# Patient Record
Sex: Female | Born: 1990 | Race: Black or African American | Hispanic: No | Marital: Single | Smoking: Never smoker
Health system: Southern US, Community
[De-identification: ages and names within clinical notes are randomized; demographics above are authoritative.]

## PROBLEM LIST (undated history)

## (undated) DIAGNOSIS — R87610 Atypical squamous cells of undetermined significance on cytologic smear of cervix (ASC-US): Secondary | ICD-10-CM

## (undated) DIAGNOSIS — R87810 Cervical high risk human papillomavirus (HPV) DNA test positive: Secondary | ICD-10-CM

## (undated) HISTORY — DX: Cervical high risk human papillomavirus (HPV) DNA test positive: R87.810

## (undated) HISTORY — PX: TONSILLECTOMY: SUR1361

## (undated) HISTORY — DX: Atypical squamous cells of undetermined significance on cytologic smear of cervix (ASC-US): R87.610

---
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
## 2013-04-03 ENCOUNTER — Emergency Department (HOSPITAL_COMMUNITY)
Admission: EM | Admit: 2013-04-03 | Discharge: 2013-04-03 | Disposition: A | Discharge status: Home / Self Care | Payer: No Typology Code available for payment source | Attending: Emergency Medicine | Admitting: Emergency Medicine

## 2013-04-03 ENCOUNTER — Emergency Department (HOSPITAL_COMMUNITY): Payer: No Typology Code available for payment source

## 2013-04-03 ENCOUNTER — Encounter (HOSPITAL_COMMUNITY): Payer: Self-pay | Admitting: Emergency Medicine

## 2013-04-03 DIAGNOSIS — F17200 Nicotine dependence, unspecified, uncomplicated: Secondary | ICD-10-CM | POA: Insufficient documentation

## 2013-04-03 DIAGNOSIS — Y92410 Unspecified street and highway as the place of occurrence of the external cause: Secondary | ICD-10-CM | POA: Insufficient documentation

## 2013-04-03 DIAGNOSIS — Z79899 Other long term (current) drug therapy: Secondary | ICD-10-CM | POA: Insufficient documentation

## 2013-04-03 DIAGNOSIS — F411 Generalized anxiety disorder: Secondary | ICD-10-CM | POA: Insufficient documentation

## 2013-04-03 DIAGNOSIS — S0990XA Unspecified injury of head, initial encounter: Secondary | ICD-10-CM | POA: Insufficient documentation

## 2013-04-03 DIAGNOSIS — S0993XA Unspecified injury of face, initial encounter: Secondary | ICD-10-CM | POA: Insufficient documentation

## 2013-04-03 DIAGNOSIS — IMO0002 Reserved for concepts with insufficient information to code with codable children: Secondary | ICD-10-CM | POA: Insufficient documentation

## 2013-04-03 DIAGNOSIS — Y9389 Activity, other specified: Secondary | ICD-10-CM | POA: Insufficient documentation

## 2013-04-03 MED ORDER — IBUPROFEN 600 MG PO TABS: 600.0000 mg | ORAL_TABLET | Freq: Four times a day (QID) | ORAL | Status: DC | PRN

## 2013-04-03 MED ORDER — CYCLOBENZAPRINE HCL 10 MG PO TABS: 10.0000 mg | ORAL_TABLET | Freq: Two times a day (BID) | ORAL | Status: DC | PRN

## 2013-04-03 MED ORDER — LORAZEPAM 1 MG PO TABS
1.0000 mg | ORAL_TABLET | Freq: Once | ORAL | Status: AC
  Administered 2013-04-03: 1 mg via ORAL
  Filled 2013-04-03: qty 1

## 2013-04-03 MED ORDER — HYDROCODONE-ACETAMINOPHEN 5-325 MG PO TABS: 1.0000 | ORAL_TABLET | ORAL | Status: DC | PRN
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
## 2013-04-03 NOTE — ED Provider Notes (Signed)
Medical screening examination/treatment/procedure(s) were performed by non-physician practitioner and as supervising physician I was immediately available for consultation/collaboration.  EKG Interpretation   None         Audree Camel, MD 04/03/13 [phone]
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
## 2013-04-03 NOTE — Progress Notes (Signed)
[facility] provided pt with a list of primary care resources.  °

## 2013-04-13 ENCOUNTER — Emergency Department (HOSPITAL_COMMUNITY)
Admission: EM | Admit: 2013-04-13 | Discharge: 2013-04-13 | Disposition: A | Discharge status: Home / Self Care | Payer: No Typology Code available for payment source | Attending: Emergency Medicine | Admitting: Emergency Medicine

## 2013-04-13 ENCOUNTER — Encounter (HOSPITAL_COMMUNITY): Payer: Self-pay | Admitting: Emergency Medicine

## 2013-04-13 DIAGNOSIS — Z880 Allergy status to penicillin: Secondary | ICD-10-CM | POA: Insufficient documentation

## 2013-04-13 DIAGNOSIS — G8911 Acute pain due to trauma: Secondary | ICD-10-CM | POA: Insufficient documentation

## 2013-04-13 DIAGNOSIS — S060X0D Concussion without loss of consciousness, subsequent encounter: Secondary | ICD-10-CM

## 2013-04-13 DIAGNOSIS — M545 Low back pain, unspecified: Secondary | ICD-10-CM | POA: Insufficient documentation

## 2013-04-13 DIAGNOSIS — F419 Anxiety disorder, unspecified: Secondary | ICD-10-CM

## 2013-04-13 DIAGNOSIS — S139XXD Sprain of joints and ligaments of unspecified parts of neck, subsequent encounter: Secondary | ICD-10-CM

## 2013-04-13 DIAGNOSIS — F17200 Nicotine dependence, unspecified, uncomplicated: Secondary | ICD-10-CM | POA: Insufficient documentation

## 2013-04-13 DIAGNOSIS — Z79899 Other long term (current) drug therapy: Secondary | ICD-10-CM | POA: Insufficient documentation

## 2013-04-13 DIAGNOSIS — M542 Cervicalgia: Secondary | ICD-10-CM | POA: Insufficient documentation

## 2013-04-13 DIAGNOSIS — R51 Headache: Secondary | ICD-10-CM | POA: Insufficient documentation

## 2013-04-13 DIAGNOSIS — M62830 Muscle spasm of back: Secondary | ICD-10-CM

## 2013-04-13 DIAGNOSIS — F411 Generalized anxiety disorder: Secondary | ICD-10-CM | POA: Insufficient documentation

## 2013-04-13 MED ORDER — LORAZEPAM 1 MG PO TABS: 1.0000 mg | ORAL_TABLET | Freq: Three times a day (TID) | ORAL | Status: DC | PRN
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
## 2013-09-18 ENCOUNTER — Encounter (HOSPITAL_COMMUNITY): Payer: Self-pay | Admitting: Emergency Medicine

## 2013-09-18 ENCOUNTER — Emergency Department (HOSPITAL_COMMUNITY)
Admission: EM | Admit: 2013-09-18 | Discharge: 2013-09-18 | Disposition: A | Discharge status: Home / Self Care | Payer: No Typology Code available for payment source | Attending: Emergency Medicine | Admitting: Emergency Medicine

## 2013-09-18 DIAGNOSIS — F17200 Nicotine dependence, unspecified, uncomplicated: Secondary | ICD-10-CM | POA: Insufficient documentation

## 2013-09-18 DIAGNOSIS — Z880 Allergy status to penicillin: Secondary | ICD-10-CM | POA: Insufficient documentation

## 2013-09-18 DIAGNOSIS — R0602 Shortness of breath: Secondary | ICD-10-CM | POA: Insufficient documentation

## 2013-09-18 DIAGNOSIS — L500 Allergic urticaria: Secondary | ICD-10-CM | POA: Insufficient documentation

## 2013-09-18 DIAGNOSIS — Z79899 Other long term (current) drug therapy: Secondary | ICD-10-CM | POA: Insufficient documentation

## 2013-09-18 MED ORDER — DIPHENHYDRAMINE HCL 25 MG PO CAPS
25.0000 mg | ORAL_CAPSULE | Freq: Four times a day (QID) | ORAL | Status: DC | PRN
  Administered 2013-09-18: 25 mg via ORAL
  Filled 2013-09-18: qty 1

## 2013-09-18 MED ORDER — FAMOTIDINE 20 MG PO TABS
20.0000 mg | ORAL_TABLET | Freq: Once | ORAL | Status: AC
  Administered 2013-09-18: 20 mg via ORAL
  Filled 2013-09-18: qty 1

## 2013-09-18 MED ORDER — FAMOTIDINE 40 MG PO TABS: 20.0000 mg | ORAL_TABLET | Freq: Every day | ORAL | Status: DC

## 2013-09-18 MED ORDER — DEXAMETHASONE SODIUM PHOSPHATE 10 MG/ML IJ SOLN
10.0000 mg | Freq: Once | INTRAMUSCULAR | Status: AC
  Administered 2013-09-18: 10 mg via INTRAMUSCULAR
  Filled 2013-09-18: qty 1

## 2013-09-18 MED ORDER — DIPHENHYDRAMINE HCL 25 MG PO TABS: 25.0000 mg | ORAL_TABLET | Freq: Four times a day (QID) | ORAL | Status: AC | PRN

## 2013-09-18 NOTE — ED Notes (Signed)
Pt states she has rash on both legs. Pt states she took a total of 4 benadryl yesterday. Pt states rash has spread to knees, vagina, and arms. Pt states her throat itches and her breathing doesn't feel normal. Pt states she also has a headache. Pt is breathing normally, Respirations equal and unlabored. Pt states she started a new detergent and soap. AAOX4, in NAD. Ambulatory to room with steady gait.

## 2013-09-18 NOTE — ED Provider Notes (Signed)
CSN: [id number]     Arrival date & time 09/18/13  1751 History  This chart was scribed for non-physician practitioner, Wynetta EmeryNicole Shree Espey, PA-C working with Jennifer Lyonsouglas Delo, MD by Jennifer StallionKayla Weeks, ED scribe. This patient was seen in room TR06C/TR06C and the patient's care was started at 6:21 PM.   Chief Complaint  Patient presents with  . Rash   The history is provided by the patient. No language interpreter was used.   HPI Comments: Jennifer Weeks is a 23 y.o. female with history of eczema who presents to the Emergency Department complaining of an, itchy worsening rash to bilateral thighs that started yesterday around 6 AM. Pt states it has spread to her knees, arms and lips. States the rash looks like hives. She states her throat was itching and she had mild SOB. Denies either currently. Pt has taken Benadryl with no relief. She started using a detergent with oxyclean in it and a new soap about 2 days ago. Pt also got new jeans recently. Denies history of diabetes.   History reviewed. No pertinent past medical history. Past Surgical History  Procedure Laterality Date  . Tonsillectomy     No family history on file. History  Substance Use Topics  . Smoking status: Light Tobacco Smoker    Types: Cigars  . Smokeless tobacco: Not on file  . Alcohol Use: No   OB History   Grav Para Term Preterm Abortions TAB SAB Ect Mult Living                 Review of Systems  Respiratory: Negative for shortness of breath.   Skin: Positive for rash.  All other systems reviewed and are negative.  Allergies  Amoxicillin-pot clavulanate and Cephalexin  Home Medications   Prior to Admission medications   Medication Sig Start Date End Date Taking? Authorizing Provider  acidophilus (RISAQUAD) CAPS capsule Take 1 capsule by mouth daily.    Historical Provider, MD  cyclobenzaprine (FLEXERIL) 10 MG tablet Take 1 tablet (10 mg total) by mouth 2 (two) times daily as needed for muscle spasms. 04/03/13   Jennifer Matasiffany  G Greene, PA-C  HYDROcodone-acetaminophen (NORCO/VICODIN) 5-325 MG per tablet Take 1-2 tablets by mouth every 4 (four) hours as needed. 04/03/13   Jennifer Irine SealG Greene, PA-C  ibuprofen (ADVIL,MOTRIN) 800 MG tablet Take 800 mg by mouth every 8 (eight) hours as needed (pain).    Historical Provider, MD  LORazepam (ATIVAN) 1 MG tablet Take 1 tablet (1 mg total) by mouth 3 (three) times daily as needed for anxiety. 04/13/13   Jennifer N Ward, DO  Multiple Vitamin (MULTIVITAMIN WITH MINERALS) TABS tablet Take 1 tablet by mouth daily.    Historical Provider, MD   BP 108/51  Pulse 81  Temp(Src) 98.8 F (37.1 C) (Oral)  Resp 18  SpO2 100%  LMP 09/18/2013  Physical Exam  Nursing note and vitals reviewed. Constitutional: She is oriented to person, place, and time. She appears well-developed and well-nourished. No distress.  HENT:  Head: Normocephalic.  Rash spares mucous membranes. No posterior oropharyngeal edema. No lip or tongue swelling.  Eyes: Conjunctivae and EOM are normal.  Cardiovascular: Normal rate, regular rhythm and normal heart sounds.   Pulmonary/Chest: Effort normal and breath sounds normal. No stridor. No respiratory distress. She has no wheezes. She has no rales.  Pt speaking in full sentences.   Musculoskeletal: Normal range of motion.  Neurological: She is alert and oriented to person, place, and time.  Skin: Rash noted.  Urticarial rash to bilateral thighs and arms with mild excoriation. No warmth, discharge or tenderness to palpation.   Psychiatric: She has a normal mood and affect.    ED Course  Procedures (including critical care time)  DIAGNOSTIC STUDIES: Oxygen Saturation is 100% on RA, normal by my interpretation.    COORDINATION OF CARE: 6:32 PM-Discussed treatment plan which includes hydrocortisone cream, benadryl, pepcid and decadron with pt at bedside and pt agreed to plan. Advised pt to change her detergent and soap. Will give pt a referral to an allergy clinic  and advised her to follow up.   Labs Review Labs Reviewed - No data to display  Imaging Review No results found.   EKG Interpretation None      MDM   Final diagnoses:  Allergic urticaria    Filed Vitals:   09/18/13 1804 09/18/13 1857  BP: 121/50 108/51  Pulse: 81 81  Temp: 98.8 F (37.1 C)   TempSrc: Oral   Resp: 18 18  SpO2: 100% 100%    Medications  dexamethasone (DECADRON) injection 10 mg (10 mg Intramuscular Given 09/18/13 1851)  famotidine (PEPCID) tablet 20 mg (20 mg Oral Given 09/18/13 1851)    Jennifer Weeks is a 23 y.o. female presenting with hives, no multisystem involvement, does not be anaphylaxis criteria. No signs of secondary infection. We'll treat with histamine blockers and steroids. Advised patient to DC  Is. Patient given allergy testing referral.  Evaluation does not show pathology that would require ongoing emergent intervention or inpatient treatment. Pt is hemodynamically stable and mentating appropriately. Discussed findings and plan with patient/guardian, who agrees with care plan. All questions answered. Return precautions discussed and outpatient follow up given.   Discharge Medication List as of 09/18/2013  6:43 PM    START taking these medications   Details  !! diphenhydrAMINE (BENADRYL) 25 MG tablet Take 1 tablet (25 mg total) by mouth every 6 (six) hours as needed., Starting 09/18/2013, Until Discontinued, Print    famotidine (PEPCID) 40 MG tablet Take 0.5 tablets (20 mg total) by mouth daily. OTC, Starting 09/18/2013, Until Discontinued, Print     !! - Potential duplicate medications found. Please discuss with provider.      Note: Portions of this report may have been transcribed using voice recognition software. Every effort was made to ensure accuracy; however, inadvertent computerized transcription errors may be present   I personally performed the services described in this documentation, which was scribed in my presence. The recorded  information has been reviewed and is accurate.  Wynetta Emeryicole Dyann Goodspeed, PA-C 09/20/13 1723

## 2013-09-18 NOTE — Discharge Instructions (Signed)
Please follow with your primary care doctor in the next 2 days for a check-up. They must obtain records for further management.   Do not hesitate to return to the Emergency Department for any new, worsening or concerning symptoms.   Hives Hives are itchy, red, swollen areas of the skin. They can vary in size and location on your body. Hives can come and go for hours or several days (acute hives) or for several weeks (chronic hives). Hives do not spread from person to person (noncontagious). They may get worse with scratching, exercise, and emotional stress. CAUSES   Allergic reaction to food, additives, or drugs.  Infections, including the common cold.  Illness, such as vasculitis, lupus, or thyroid disease.  Exposure to sunlight, heat, or cold.  Exercise.  Stress.  Contact with chemicals. SYMPTOMS   Red or white swollen patches on the skin. The patches may change size, shape, and location quickly and repeatedly.  Itching.  Swelling of the hands, feet, and face. This may occur if hives develop deeper in the skin. DIAGNOSIS  Your caregiver can usually tell what is wrong by performing a physical exam. Skin or blood tests may also be done to determine the cause of your hives. In some cases, the cause cannot be determined. TREATMENT  Mild cases usually get better with medicines such as antihistamines. Severe cases may require an emergency epinephrine injection. If the cause of your hives is known, treatment includes avoiding that trigger.  HOME CARE INSTRUCTIONS   Avoid causes that trigger your hives.  Take antihistamines as directed by your caregiver to reduce the severity of your hives. Non-sedating or low-sedating antihistamines are usually recommended. Do not drive while taking an antihistamine.  Take any other medicines prescribed for itching as directed by your caregiver.  Wear loose-fitting clothing.  Keep all follow-up appointments as directed by your caregiver. SEEK  MEDICAL CARE IF:   You have persistent or severe itching that is not relieved with medicine.  You have painful or swollen joints. SEEK IMMEDIATE MEDICAL CARE IF:   You have a fever.  Your tongue or lips are swollen.  You have trouble breathing or swallowing.  You feel tightness in the throat or chest.  You have abdominal pain. These problems may be the first sign of a life-threatening allergic reaction. Call your local emergency services (911 in [place] MAKE SURE YOU:   Understand these instructions.  Will watch your condition.  Will get help right away if you are not doing well or get worse. Document Released: 05/07/2005 Document Revised: 11/06/2011 Document Reviewed: 07/31/2011 [facility] Patient Information 2014 [place], [place].

## 2013-09-21 NOTE — ED Provider Notes (Signed)
Medical screening examination/treatment/procedure(s) were performed by non-physician practitioner and as supervising physician I was immediately available for consultation/collaboration.     Lyndsee Casa, MD 09/21/13 0920 

## 2014-04-12 ENCOUNTER — Emergency Department (HOSPITAL_COMMUNITY)
Admission: EM | Admit: 2014-04-12 | Discharge: 2014-04-12 | Disposition: A | Discharge status: Home / Self Care | Payer: No Typology Code available for payment source | Attending: Emergency Medicine | Admitting: Emergency Medicine

## 2014-04-12 ENCOUNTER — Emergency Department (HOSPITAL_COMMUNITY): Payer: No Typology Code available for payment source

## 2014-04-12 ENCOUNTER — Encounter (HOSPITAL_COMMUNITY): Payer: Self-pay | Admitting: Emergency Medicine

## 2014-04-12 DIAGNOSIS — Z880 Allergy status to penicillin: Secondary | ICD-10-CM | POA: Insufficient documentation

## 2014-04-12 DIAGNOSIS — Z79899 Other long term (current) drug therapy: Secondary | ICD-10-CM | POA: Insufficient documentation

## 2014-04-12 DIAGNOSIS — Z3202 Encounter for pregnancy test, result negative: Secondary | ICD-10-CM | POA: Insufficient documentation

## 2014-04-12 DIAGNOSIS — Z720 Tobacco use: Secondary | ICD-10-CM | POA: Insufficient documentation

## 2014-04-12 DIAGNOSIS — N83201 Unspecified ovarian cyst, right side: Secondary | ICD-10-CM

## 2014-04-12 DIAGNOSIS — N83202 Unspecified ovarian cyst, left side: Secondary | ICD-10-CM

## 2014-04-12 DIAGNOSIS — R102 Pelvic and perineal pain: Secondary | ICD-10-CM

## 2014-04-12 DIAGNOSIS — N8320 Unspecified ovarian cysts: Secondary | ICD-10-CM | POA: Insufficient documentation

## 2014-04-12 LAB — URINALYSIS, ROUTINE W REFLEX MICROSCOPIC
BILIRUBIN URINE: NEGATIVE
Glucose, UA: NEGATIVE mg/dL
HGB URINE DIPSTICK: NEGATIVE
Ketones, ur: NEGATIVE mg/dL
Leukocytes, UA: NEGATIVE
NITRITE: NEGATIVE
PH: 6 (ref 5.0–8.0)
Protein, ur: NEGATIVE mg/dL
SPECIFIC GRAVITY, URINE: 1.021 (ref 1.005–1.030)
UROBILINOGEN UA: 0.2 mg/dL (ref 0.0–1.0)

## 2014-04-12 LAB — PREGNANCY, URINE: Preg Test, Ur: NEGATIVE

## 2014-04-12 MED ORDER — IBUPROFEN 600 MG PO TABS: 600.0000 mg | ORAL_TABLET | Freq: Three times a day (TID) | ORAL | Status: DC | PRN

## 2014-04-12 NOTE — ED Provider Notes (Signed)
CSN: [id number]     Arrival date & time 04/12/14  1601 History   First MD Initiated Contact with Patient 04/12/14 1617     Chief Complaint  Patient presents with  . Abdominal Pain     (Consider location/radiation/quality/duration/timing/severity/associated sxs/prior Treatment) Patient is a 23 y.o. female presenting with abdominal pain. The history is provided by the patient and a parent.  Abdominal Pain Associated symptoms: no chest pain, no chills, no diarrhea, no dysuria, no fever, no shortness of breath, no sore throat, no vaginal bleeding and no vomiting   pt c/o intermittent bilateral lower abdominal pain for the past 2+ weeks. Is dull, intermittent during course of day, but has some pain every day. Dull to cramping, mild, no severe pain, not progressive, does not radiate although also does occasionally feel in low back. No specific exacerbating or alleviating factors. Denies same pain previously.  Normal appetite. No nvd. Mild constipation in past, had normal bm yesterday. No prior abd surgery. No dysuria or hematuria, no gu c/o. No vaginal discharge, occasional whitish discharge. States has taken 4 pregnancy tests, neg. No birth control use. Denies fever or chills. Denies hx ovarian cysts or endometriosis. +fam hx fibroids.       History reviewed. No pertinent past medical history. Past Surgical History  Procedure Laterality Date  . Tonsillectomy     No family history on file. History  Substance Use Topics  . Smoking status: Light Tobacco Smoker    Types: Cigars  . Smokeless tobacco: Not on file  . Alcohol Use: No   OB History    No data available     Review of Systems  Constitutional: Negative for fever and chills.  HENT: Negative for sore throat.   Eyes: Negative for redness.  Respiratory: Negative for shortness of breath.   Cardiovascular: Negative for chest pain.  Gastrointestinal: Positive for abdominal pain. Negative for vomiting and diarrhea.  Endocrine:  Negative for polyuria.  Genitourinary: Negative for dysuria, flank pain and vaginal bleeding.  Musculoskeletal: Negative for back pain and neck pain.  Skin: Negative for rash.  Neurological: Negative for headaches.  Hematological: Does not bruise/bleed easily.  Psychiatric/Behavioral: Negative for confusion.      Allergies  Amoxicillin-pot clavulanate and Cephalexin  Home Medications   Prior to Admission medications   Medication Sig Start Date End Date Taking? Authorizing Provider  diphenhydrAMINE (BENADRYL) 25 MG tablet Take 50 mg by mouth every 6 (six) hours as needed for allergies.    Historical Provider, MD  diphenhydrAMINE (BENADRYL) 25 MG tablet Take 1 tablet (25 mg total) by mouth every 6 (six) hours as needed. 09/18/13   Nicole Pisciotta, PA-C  famotidine (PEPCID) 40 MG tablet Take 0.5 tablets (20 mg total) by mouth daily. OTC 09/18/13   Nicole Pisciotta, PA-C  ibuprofen (ADVIL,MOTRIN) 800 MG tablet Take 800 mg by mouth every 8 (eight) hours as needed (pain).    Historical Provider, MD  Multiple Vitamin (MULTIVITAMIN WITH MINERALS) TABS tablet Take 1 tablet by mouth daily.    Historical Provider, MD   BP 129/76 mmHg  Pulse 94  Temp(Src) 98 F (36.7 C)  Resp 17  Ht 5\' 7"  (1.702 m)  Wt 232 lb (105.235 kg)  BMI 36.33 kg/m2  SpO2 98%  LMP 02/04/2014 Physical Exam  Constitutional: She is oriented to person, place, and time. She appears well-developed and well-nourished. No distress.  HENT:  Mouth/Throat: Oropharynx is clear and moist.  Eyes: Conjunctivae are normal. No scleral icterus.  Neck: Neck  supple. No tracheal deviation present.  Cardiovascular: Normal rate, regular rhythm, normal heart sounds and intact distal pulses.   Pulmonary/Chest: Effort normal and breath sounds normal. No respiratory distress.  Abdominal: Soft. Normal appearance and bowel sounds are normal. She exhibits no distension and no mass. There is no tenderness. There is no rebound and no guarding.   obese  Genitourinary:  No cva tenderness. Normal ext genitalia. Cervix closed. No purulent or malodorous discharge. No cmt. No adx tenderness.   Musculoskeletal: She exhibits no edema or tenderness.  Neurological: She is alert and oriented to person, place, and time.  Skin: Skin is warm and dry. No rash noted. She is not diaphoretic.  Psychiatric: She has a normal mood and affect.  Nursing note and vitals reviewed.   ED Course  Procedures (including critical care time) Labs Review  Results for orders placed or performed during the hospital encounter of 04/12/14  Pregnancy, urine  Result Value Ref Range   Preg Test, Ur NEGATIVE NEGATIVE  Urinalysis, Routine w reflex microscopic  Result Value Ref Range   Color, Urine YELLOW YELLOW   APPearance CLEAR CLEAR   Specific Gravity, Urine 1.021 1.005 - 1.030   pH 6.0 5.0 - 8.0   Glucose, UA NEGATIVE NEGATIVE mg/dL   Hgb urine dipstick NEGATIVE NEGATIVE   Bilirubin Urine NEGATIVE NEGATIVE   Ketones, ur NEGATIVE NEGATIVE mg/dL   Protein, ur NEGATIVE NEGATIVE mg/dL   Urobilinogen, UA 0.2 0.0 - 1.0 mg/dL   Nitrite NEGATIVE NEGATIVE   Leukocytes, UA NEGATIVE NEGATIVE   [place] Transvaginal Non-ob  04/12/2014   CLINICAL DATA:  Pelvic pain  EXAM: TRANSABDOMINAL AND TRANSVAGINAL ULTRASOUND OF PELVIS  TECHNIQUE: Both transabdominal and transvaginal ultrasound examinations of the pelvis were performed. Transabdominal technique was performed for global imaging of the pelvis including uterus, ovaries, adnexal regions, and pelvic cul-de-sac. It was necessary to proceed with endovaginal exam following the transabdominal exam to visualize the ovaries.  COMPARISON:  None  FINDINGS: Uterus  Measurements: 7.1 x 3.9 x 4.8 cm. Nabothian cyst is noted.  Endometrium  Thickness: 9 mm.  No focal abnormality visualized.  Right ovary  Measurements: 3.2 x 2.6 x 2.5 cm. There is a cyst noted within the right ovary measuring 1.5 cm in greatest dimension.  Left ovary   Measurements: 5.1 x 3.8 x 4.4 cm. A 3.6 cm cyst is noted within the left ovary.  Other findings  Minimal free pelvic fluid is noted.  IMPRESSION: Bilateral ovarian cysts more dominant on the left than the right.   Electronically Signed   By: Alcide CleverMark  Lukens M.D.   On: 04/12/2014 19:35   [place] Pelvis Complete  04/12/2014   CLINICAL DATA:  Pelvic pain  EXAM: TRANSABDOMINAL AND TRANSVAGINAL ULTRASOUND OF PELVIS  TECHNIQUE: Both transabdominal and transvaginal ultrasound examinations of the pelvis were performed. Transabdominal technique was performed for global imaging of the pelvis including uterus, ovaries, adnexal regions, and pelvic cul-de-sac. It was necessary to proceed with endovaginal exam following the transabdominal exam to visualize the ovaries.  COMPARISON:  None  FINDINGS: Uterus  Measurements: 7.1 x 3.9 x 4.8 cm. Nabothian cyst is noted.  Endometrium  Thickness: 9 mm.  No focal abnormality visualized.  Right ovary  Measurements: 3.2 x 2.6 x 2.5 cm. There is a cyst noted within the right ovary measuring 1.5 cm in greatest dimension.  Left ovary  Measurements: 5.1 x 3.8 x 4.4 cm. A 3.6 cm cyst is noted within the left ovary.  Other findings  Minimal free pelvic fluid is noted.  IMPRESSION: Bilateral ovarian cysts more dominant on the left than the right.   Electronically Signed   By: Alcide CleverMark  Lukens M.D.   On: 04/12/2014 19:35      MDM  Labs.  Reviewed nursing notes and prior charts for additional history.   Recheck abd soft nt. No fevers.   Discussed u/s w pt, and that gc/chlam pending.  No pain currently. Drinking fluids. abd soft nt.    Pt appears stable for d/c.  Will refer to gyn follow up.   Pt currently appears stable for d/c.      Suzi RootsKevin E Messina Kosinski, MD 04/12/14 2024

## 2014-04-12 NOTE — ED Notes (Signed)
Pt presents with lower abdominal discomfort for the past 2 weeks, admits to some vaginal discharge.  LMP 02/04/2014, pt reports taking multiple pregnancy tests at home all of which were negative.  Pt admits to feeling weak and tired often and occasional nausea.

## 2014-04-12 NOTE — Discharge Instructions (Signed)
It was our pleasure to provide your ER care today - we hope that you feel better.  Your pregnancy test is negative.  Your ultrasound was read as follows:  Right ovary  Measurements: 3.2 x 2.6 x 2.5 cm. There is a cyst noted within the right ovary measuring 1.5 cm in greatest dimension.  Left ovary  Measurements: 5.1 x 3.8 x 4.4 cm. A 3.6 cm cyst is noted within the left ovary.  Other findings  Minimal free pelvic fluid is noted.  IMPRESSION: Bilateral ovarian cysts more dominant on the left than the right.   For ovarian cysts/pelvic discomfort, [facility] in the coming week - see referral - call to arrange appointment.   You may take motrin as need for pain. Take with food.  Return to ER right away if worse, new symptoms, fevers, worsening or severe pain, persistent vomiting, other concern.      Abdominal Pain, Women Abdominal (stomach, pelvic, or belly) pain can be caused by many things. It is important to tell your doctor:  The location of the pain.  Does it come and go or is it present all the time?  Are there things that start the pain (eating certain foods, exercise)?  Are there other symptoms associated with the pain (fever, nausea, vomiting, diarrhea)? All of this is helpful to know when trying to find the cause of the pain. CAUSES   Stomach: virus or bacteria infection, or ulcer.  Intestine: appendicitis (inflamed appendix), regional ileitis (Crohn's disease), ulcerative colitis (inflamed colon), irritable bowel syndrome, diverticulitis (inflamed diverticulum of the colon), or cancer of the stomach or intestine.  Gallbladder disease or stones in the gallbladder.  Kidney disease, kidney stones, or infection.  Pancreas infection or cancer.  Fibromyalgia (pain disorder).  Diseases of the female organs:  Uterus: fibroid (non-cancerous) tumors or infection.  Fallopian tubes: infection or tubal pregnancy.  Ovary: cysts or  tumors.  Pelvic adhesions (scar tissue).  Endometriosis (uterus lining tissue growing in the pelvis and on the pelvic organs).  Pelvic congestion syndrome (female organs filling up with blood just before the menstrual period).  Pain with the menstrual period.  Pain with ovulation (producing an egg).  Pain with an IUD (intrauterine device, birth control) in the uterus.  Cancer of the female organs.  Functional pain (pain not caused by a disease, may improve without treatment).  Psychological pain.  Depression. DIAGNOSIS  Your doctor will decide the seriousness of your pain by doing an examination.  Blood tests.  X-rays.  Ultrasound.  CT scan (computed tomography, special type of X-ray).  MRI (magnetic resonance imaging).  Cultures, for infection.  Barium enema (dye inserted in the large intestine, to better view it with X-rays).  Colonoscopy (looking in intestine with a lighted tube).  Laparoscopy (minor surgery, looking in abdomen with a lighted tube).  Major abdominal exploratory surgery (looking in abdomen with a large incision). TREATMENT  The treatment will depend on the cause of the pain.   Many cases can be observed and treated at home.  Over-the-counter medicines recommended by your caregiver.  Prescription medicine.  Antibiotics, for infection.  Birth control pills, for painful periods or for ovulation pain.  Hormone treatment, for endometriosis.  Nerve blocking injections.  Physical therapy.  Antidepressants.  Counseling with a psychologist or psychiatrist.  Minor or major surgery. HOME CARE INSTRUCTIONS   Do not take laxatives, unless directed by your caregiver.  Take over-the-counter pain medicine only if ordered  by your caregiver. Do not take aspirin because it can cause an upset stomach or bleeding.  Try a clear liquid diet (broth or water) as ordered by your caregiver. Slowly move to a bland diet, as tolerated, if the pain is  related to the stomach or intestine.  Have a thermometer and take your temperature several times a day, and record it.  Bed rest and sleep, if it helps the pain.  Avoid sexual intercourse, if it causes pain.  Avoid stressful situations.  Keep your follow-up appointments and tests, as your caregiver orders.  If the pain does not go away with medicine or surgery, you may try:  Acupuncture.  Relaxation exercises (yoga, meditation).  Group therapy.  Counseling. SEEK MEDICAL CARE IF:   You notice certain foods cause stomach pain.  Your home care treatment is not helping your pain.  You need stronger pain medicine.  You want your IUD removed.  You feel faint or lightheaded.  You develop nausea and vomiting.  You develop a rash.  You are having side effects or an allergy to your medicine. SEEK IMMEDIATE MEDICAL CARE IF:   Your pain does not go away or gets worse.  You have a fever.  Your pain is felt only in portions of the abdomen. The right side could possibly be appendicitis. The left lower portion of the abdomen could be colitis or diverticulitis.  You are passing blood in your stools (bright red or black tarry stools, with or without vomiting).  You have blood in your urine.  You develop chills, with or without a fever.  You pass out. MAKE SURE YOU:   Understand these instructions.  Will watch your condition.  Will get help right away if you are not doing well or get worse. Document Released: 03/04/2007 Document Revised: 09/21/2013 Document Reviewed: 03/24/2009 [facility] Patient Information 2015 [place], [place]. This information is not intended to replace advice given to you by your health care provider. Make sure you discuss any questions you have with your health care provider.    Ovarian Cyst An ovarian cyst is a fluid-filled sac that forms on an ovary. The ovaries are small organs that produce eggs in women. Various types of cysts can form on the  ovaries. Most are not cancerous. Many do not cause problems, and they often go away on their own. Some may cause symptoms and require treatment. Common types of ovarian cysts include:  Functional cysts--These cysts may occur every month during the menstrual cycle. This is normal. The cysts usually go away with the next menstrual cycle if the woman does not get pregnant. Usually, there are no symptoms with a functional cyst.  Endometrioma cysts--These cysts form from the tissue that lines the uterus. They are also called "chocolate cysts" because they become filled with blood that turns brown. This type of cyst can cause pain in the lower abdomen during intercourse and with your menstrual period.  Cystadenoma cysts--This type develops from the cells on the outside of the ovary. These cysts can get very big and cause lower abdomen pain and pain with intercourse. This type of cyst can twist on itself, cut off its blood supply, and cause severe pain. It can also easily rupture and cause a lot of pain.  Dermoid cysts--This type of cyst is sometimes found in both ovaries. These cysts may contain different kinds of body tissue, such as skin, teeth, hair, or cartilage. They usually do not cause symptoms unless they get very big.  Theca  lutein cysts--These cysts occur when too much of a certain hormone (human chorionic gonadotropin) is produced and overstimulates the ovaries to produce an egg. This is most common after procedures used to assist with the conception of a baby (in vitro fertilization). CAUSES   Fertility drugs can cause a condition in which multiple large cysts are formed on the ovaries. This is called ovarian hyperstimulation syndrome.  A condition called polycystic ovary syndrome can cause hormonal imbalances that can lead to nonfunctional ovarian cysts. SIGNS AND SYMPTOMS  Many ovarian cysts do not cause symptoms. If symptoms are present, they may include:  Pelvic pain or pressure.  Pain  in the lower abdomen.  Pain during sexual intercourse.  Increasing girth (swelling) of the abdomen.  Abnormal menstrual periods.  Increasing pain with menstrual periods.  Stopping having menstrual periods without being pregnant. DIAGNOSIS  These cysts are commonly found during a routine or annual pelvic exam. Tests may be ordered to find out more about the cyst. These tests may include:  Ultrasound.  X-ray of the pelvis.  CT scan.  MRI.  Blood tests. TREATMENT  Many ovarian cysts go away on their own without treatment. Your health care provider may want to check your cyst regularly for 2-3 months to see if it changes. For women in menopause, it is particularly important to monitor a cyst closely because of the higher rate of ovarian cancer in menopausal women. When treatment is needed, it may include any of the following:  A procedure to drain the cyst (aspiration). This may be done using a long needle and ultrasound. It can also be done through a laparoscopic procedure. This involves using a thin, lighted tube with a tiny camera on the end (laparoscope) inserted through a small incision.  Surgery to remove the whole cyst. This may be done using laparoscopic surgery or an open surgery involving a larger incision in the lower abdomen.  Hormone treatment or birth control pills. These methods are sometimes used to help dissolve a cyst. HOME CARE INSTRUCTIONS   Only take over-the-counter or prescription medicines as directed by your health care provider.  Follow up with your health care provider as directed.  Get regular pelvic exams and Pap tests. SEEK MEDICAL CARE IF:   Your periods are late, irregular, or painful, or they stop.  Your pelvic pain or abdominal pain does not go away.  Your abdomen becomes larger or swollen.  You have pressure on your bladder or trouble emptying your bladder completely.  You have pain during sexual intercourse.  You have feelings of  fullness, pressure, or discomfort in your stomach.  You lose weight for no apparent reason.  You feel generally ill.  You become constipated.  You lose your appetite.  You develop acne.  You have an increase in body and facial hair.  You are gaining weight, without changing your exercise and eating habits.  You think you are pregnant. SEEK IMMEDIATE MEDICAL CARE IF:   You have increasing abdominal pain.  You feel sick to your stomach (nauseous), and you throw up (vomit).  You develop a fever that comes on suddenly.  You have abdominal pain during a bowel movement.  Your menstrual periods become heavier than usual. MAKE SURE YOU:  Understand these instructions.  Will watch your condition.  Will get help right away if you are not doing well or get worse. Document Released: 05/07/2005 Document Revised: 05/12/2013 Document Reviewed: 01/12/2013 [facility] Patient Information 2015 [place]. This information is not intended  to replace advice given to you by your health care provider. Make sure you discuss any questions you have with your health care provider. ° °

## 2014-04-12 NOTE — ED Notes (Signed)
Lab called advising wet prep arrived without saline in it,  Dr. Denton LankSteinl made aware, advised he did not want to repeat test. Lab made aware no repeat of test to be done.

## 2014-04-12 NOTE — ED Notes (Signed)
Pt brought back to room with family in tow; pt undressed, in gown, on monitor, continuous pulse oximetry and blood pressure cuff; family in tow; Chelsea, RN aware of pt

## 2014-04-19 LAB — GC/CHLAMYDIA PROBE AMP
CT PROBE, AMP APTIMA: NEGATIVE
GC PROBE AMP APTIMA: NEGATIVE

## 2014-07-06 ENCOUNTER — Ambulatory Visit (INDEPENDENT_AMBULATORY_CARE_PROVIDER_SITE_OTHER): Payer: 59 | Admitting: Gynecology

## 2014-07-06 ENCOUNTER — Other Ambulatory Visit (HOSPITAL_COMMUNITY)
Admission: RE | Admit: 2014-07-06 | Discharge: 2014-07-06 | Disposition: A | Discharge status: Home / Self Care | Payer: 59 | Source: Ambulatory Visit | Attending: Gynecology | Admitting: Gynecology

## 2014-07-06 ENCOUNTER — Encounter: Payer: Self-pay | Admitting: Gynecology

## 2014-07-06 VITALS — BP 130/80 | Ht 67.0 in | Wt 277.0 lb

## 2014-07-06 DIAGNOSIS — N83201 Unspecified ovarian cyst, right side: Secondary | ICD-10-CM

## 2014-07-06 DIAGNOSIS — N83202 Unspecified ovarian cyst, left side: Secondary | ICD-10-CM

## 2014-07-06 DIAGNOSIS — N926 Irregular menstruation, unspecified: Secondary | ICD-10-CM

## 2014-07-06 DIAGNOSIS — Z113 Encounter for screening for infections with a predominantly sexual mode of transmission: Secondary | ICD-10-CM

## 2014-07-06 DIAGNOSIS — Z01411 Encounter for gynecological examination (general) (routine) with abnormal findings: Secondary | ICD-10-CM | POA: Insufficient documentation

## 2014-07-06 DIAGNOSIS — N8320 Unspecified ovarian cysts: Secondary | ICD-10-CM

## 2014-07-06 DIAGNOSIS — Z01419 Encounter for gynecological examination (general) (routine) without abnormal findings: Secondary | ICD-10-CM

## 2014-07-06 LAB — COMPREHENSIVE METABOLIC PANEL
ALBUMIN: 3.9 g/dL (ref 3.5–5.2)
ALK PHOS: 48 U/L (ref 39–117)
ALT: 14 U/L (ref 0–35)
AST: 15 U/L (ref 0–37)
BUN: 8 mg/dL (ref 6–23)
CO2: 23 mEq/L (ref 19–32)
Calcium: 9.2 mg/dL (ref 8.4–10.5)
Chloride: 106 mEq/L (ref 96–112)
Creat: 0.87 mg/dL (ref 0.50–1.10)
GLUCOSE: 81 mg/dL (ref 70–99)
POTASSIUM: 4 meq/L (ref 3.5–5.3)
SODIUM: 138 meq/L (ref 135–145)
TOTAL PROTEIN: 7.2 g/dL (ref 6.0–8.3)
Total Bilirubin: 0.3 mg/dL (ref 0.2–1.2)

## 2014-07-06 LAB — CBC WITH DIFFERENTIAL/PLATELET
BASOS ABS: 0.1 10*3/uL (ref 0.0–0.1)
Basophils Relative: 1 % (ref 0–1)
EOS ABS: 0.1 10*3/uL (ref 0.0–0.7)
EOS PCT: 1 % (ref 0–5)
HEMATOCRIT: 43.1 % (ref 36.0–46.0)
HEMOGLOBIN: 14.7 g/dL (ref 12.0–15.0)
LYMPHS ABS: 3 10*3/uL (ref 0.7–4.0)
LYMPHS PCT: 40 % (ref 12–46)
MCH: 26.9 pg (ref 26.0–34.0)
MCHC: 34.1 g/dL (ref 30.0–36.0)
MCV: 78.9 fL (ref 78.0–100.0)
MONO ABS: 0.5 10*3/uL (ref 0.1–1.0)
MPV: 10 fL (ref 8.6–12.4)
Monocytes Relative: 7 % (ref 3–12)
NEUTROS PCT: 51 % (ref 43–77)
Neutro Abs: 3.8 10*3/uL (ref 1.7–7.7)
Platelets: 299 10*3/uL (ref 150–400)
RBC: 5.46 MIL/uL — AB (ref 3.87–5.11)
RDW: 14.3 % (ref 11.5–15.5)
WBC: 7.5 10*3/uL (ref 4.0–10.5)

## 2014-07-06 LAB — TSH: TSH: 3.129 u[IU]/mL (ref 0.350–4.500)

## 2014-07-06 MED ORDER — NORETHINDRONE ACET-ETHINYL EST 1-20 MG-MCG PO TABS: 1.0000 | ORAL_TABLET | Freq: Every day | ORAL | Status: DC

## 2014-07-06 NOTE — Progress Notes (Signed)
Jennifer FerraraRion D Doswell 11/01/1990 [id number]        24 y.o.  G0P0000 new patient for annual exam.  Several other issues noted below.  Past medical history,surgical history, problem list, medications, allergies, family history and social history were all reviewed and documented as reviewed in the EPIC chart.  ROS:  Performed with pertinent positives and negatives included in the history, assessment and plan.   Additional significant findings :  none   Exam: [facility] personassistant Filed Vitals:   07/06/14 1458  BP: 130/80  Height: 5\' 7"  (1.702 m)  Weight: 277 lb (125.646 kg)   General appearance:  Normal affect, orientation and appearance. Skin: Grossly normal HEENT: Without gross lesions.  No cervical or supraclavicular adenopathy. Thyroid normal.  Lungs:  Clear without wheezing, rales or rhonchi Cardiac: RR, without RMG Abdominal:  Soft, nontender, without masses, guarding, rebound, organomegaly or hernia Breasts:  Examined lying and sitting without masses, retractions, discharge or axillary adenopathy. Pelvic:  Ext/BUS/vagina normal  Cervix normal. Pap Pap smear. GC/Chlamydia  Uterus anteverted, grossly normal size, shape,  midline and mobile nontender   Adnexa  Without masses or tenderness    Anus and perineum  Normal     Assessment/Plan:  24 y.o. G0P0000 female for annual exam irregular menses, without contraception.   1. Patient notes a history of irregular menses over the past several years. She will have regular monthly menses for several months and then skip 2-3 months at a time. No prolonged or atypical bleeding. No significant abnormal hair growth weight changes skin changes. Had ultrasound elsewhere in November which showed bilateral ovarian cysts, largest on left measuring 3.6 cm. Reviewed PCOS and the various criteria for diagnoses. Will check baseline FSH TSH prolactin. Not having significant hyperandrogenic symptoms.  Not actively using birth control at this point but would consider  birth control pills.  Last menstrual period January. Will check hCG and assuming negative withdrawal with Provera 10 mg 10 days. Will start Loestrin 1/20 BCP equivalent  Sunday following withdrawal bleed. Patient knows to call if she does not have a withdrawal bleed. Refill 1 year provided and we will cycle her. She'll call if she does not have regular cycles. Options for every other month withdrawal reviewed. Offbrand labeling discussed.  We will go ahead and check baseline ultrasound also given her history of the ovarian cysts. Patient will follow up for this. 2. STD screening reviewed and offered. GC/Chlamydia done but patient declined serum screening. 3. Breast health. Patient does have large breasts which is causing issues as far as back discomfort and shoulder strain with bra straps. Reviewed options to include weight loss and reduction surgery. Follow up with plastic surgeon if interested in reduction surgery.  SBE monthly reviewed. 4. Pap smear. First Pap smear done today as she is 23. Assuming negative then plan 3 year follow up per current screening guidelines. 5. Health maintenance.  Baseline CBC, comprehensive metabolic panel, urinalysis, done with her FSH, TSH, prolactin, hCG, GC/chlamydia, Pap smear. Follow up for ultrasound     Dara LordsFONTAINE,Jennifer Mcgillis P MD, 4:01 PM 07/06/2014

## 2014-07-06 NOTE — Addendum Note (Signed)
Addended by: Dayna BarkerGARDNER, Mahasin Riviere K on: 07/06/2014 04:20 PM   Modules accepted: Orders

## 2014-07-06 NOTE — Patient Instructions (Signed)
Follow up ultrasound as scheduled. Start on the birth control pills after the progesterone pill withdrawal menses. We will call you with the laboratory results and the prescription for the progesterone.

## 2014-07-07 ENCOUNTER — Other Ambulatory Visit: Payer: Self-pay | Admitting: Gynecology

## 2014-07-07 LAB — URINALYSIS W MICROSCOPIC + REFLEX CULTURE
Bacteria, UA: NONE SEEN
Bilirubin Urine: NEGATIVE
CRYSTALS: NONE SEEN
Casts: NONE SEEN
GLUCOSE, UA: NEGATIVE mg/dL
Hgb urine dipstick: NEGATIVE
Ketones, ur: NEGATIVE mg/dL
Leukocytes, UA: NEGATIVE
Nitrite: NEGATIVE
PH: 5.5 (ref 5.0–8.0)
Protein, ur: NEGATIVE mg/dL
Specific Gravity, Urine: 1.021 (ref 1.005–1.030)
UROBILINOGEN UA: 0.2 mg/dL (ref 0.0–1.0)

## 2014-07-07 LAB — GC/CHLAMYDIA PROBE AMP
CT Probe RNA: NEGATIVE
GC Probe RNA: NEGATIVE

## 2014-07-07 LAB — PROLACTIN: PROLACTIN: 4.6 ng/mL

## 2014-07-07 LAB — HCG, SERUM, QUALITATIVE: Preg, Serum: NEGATIVE

## 2014-07-07 LAB — FOLLICLE STIMULATING HORMONE: FSH: 6.4 m[IU]/mL

## 2014-07-07 MED ORDER — MEDROXYPROGESTERONE ACETATE 10 MG PO TABS: 10.0000 mg | ORAL_TABLET | Freq: Every day | ORAL | Status: DC

## 2014-07-08 LAB — CYTOLOGY - PAP

## 2014-07-09 ENCOUNTER — Telehealth: Payer: Self-pay | Admitting: *Deleted

## 2014-07-09 NOTE — Telephone Encounter (Signed)
Pt called with questions on when to start birth control pills, I told patient the same information as noted on result note 07/06/14.

## 2014-07-19 ENCOUNTER — Other Ambulatory Visit: Payer: Self-pay | Admitting: Gynecology

## 2014-07-19 ENCOUNTER — Ambulatory Visit (INDEPENDENT_AMBULATORY_CARE_PROVIDER_SITE_OTHER): Payer: 59 | Admitting: Gynecology

## 2014-07-19 ENCOUNTER — Encounter: Payer: Self-pay | Admitting: Gynecology

## 2014-07-19 ENCOUNTER — Ambulatory Visit (INDEPENDENT_AMBULATORY_CARE_PROVIDER_SITE_OTHER): Payer: 59

## 2014-07-19 VITALS — BP 130/80

## 2014-07-19 DIAGNOSIS — N926 Irregular menstruation, unspecified: Secondary | ICD-10-CM

## 2014-07-19 DIAGNOSIS — N83201 Unspecified ovarian cyst, right side: Secondary | ICD-10-CM

## 2014-07-19 DIAGNOSIS — N83202 Unspecified ovarian cyst, left side: Secondary | ICD-10-CM

## 2014-07-19 DIAGNOSIS — N8320 Unspecified ovarian cysts: Secondary | ICD-10-CM

## 2014-07-19 DIAGNOSIS — E282 Polycystic ovarian syndrome: Secondary | ICD-10-CM

## 2014-07-19 NOTE — Progress Notes (Signed)
Jennifer Weeks 05/10/1991 [id number]        24 y.o.  G0P0000 Presents for ultrasound. History of irregular bleeding with bilateral ovarian cysts on prior ultrasound elsewhere. Blood work here includes negative hCG TSH prolactin FSH. Currently is finishing a course of Provera to withdraw and then is going to start Loestrin 1/20 equivalent BCPs. Ultrasound ordered to follow up on prior ovarian cysts, the largest measuring 3.6 cm..  Past medical history,surgical history, problem list, medications, allergies, family history and social history were all reviewed and documented in the EPIC chart.  Directed ROS with pertinent positives and negatives documented in the history of present illness/assessment and plan.  Ultrasound shows uterus normal size and echotexture. Endometrial echo 7.5 mm. Right ovary with 2 thin-walled echo-free avascular cysts 22 and 20 mm means. Left ovary with thin-walled echo-free avascular 19 mm mean cyst.  Cul-de-sac negative.  Assessment/Plan:  24 y.o. G0P0000 with above history. Ultrasound shows benign appearing bilateral ovarian cysts smaller than previously measured. Patient will go ahead and start her oral contraceptives as planned and repeat follow up ultrasound in 4 months for ovarian surveillance. Patient agrees to follow up for this.     Dara LordsFONTAINE,TIMOTHY P MD, 10:50 AM 07/19/2014

## 2014-07-19 NOTE — Patient Instructions (Signed)
Follow up for repeat ultrasound in 4 months. Call me if you have any issues when starting the birth control pills.

## 2014-08-11 ENCOUNTER — Telehealth: Payer: Self-pay | Admitting: *Deleted

## 2014-08-11 NOTE — Telephone Encounter (Signed)
Pt is in 2nd week of new birth control pills, c/o headaches, nausea, no vomiting, sore breast. Pt also mention spotting, I explained to patient that not abnormal to have spotting when taking new pills. Patient asked if she should watch for now? Any recommendations. Please advise

## 2014-08-12 NOTE — Telephone Encounter (Signed)
Can take up to 2-3 packs to get regulated. I would monitor the symptoms and continue the pills for now. Call if continues.

## 2014-08-12 NOTE — Telephone Encounter (Signed)
Patient informed with the below note.  

## 2014-08-16 ENCOUNTER — Telehealth: Payer: Self-pay | Admitting: *Deleted

## 2014-08-16 MED ORDER — NORGESTIMATE-ETH ESTRADIOL 0.25-35 MG-MCG PO TABS: 1.0000 | ORAL_TABLET | Freq: Every day | ORAL | Status: DC

## 2014-08-16 NOTE — Telephone Encounter (Signed)
Pt other Jennifer Weeks called stating bleeding is much heavier from birth control pills, pt started pill in Feb. C/o heavy bleeding and bad cramping and feeling depression, pt has stopped pills would wish to start on another pill. Please advise

## 2014-08-16 NOTE — Telephone Encounter (Signed)
Pt mother informed, Rx sent 

## 2014-08-16 NOTE — Telephone Encounter (Signed)
She can try Sprintec equivalent Sunday start after menses. Refill through next annual exam. Call if any issues.

## 2014-08-18 ENCOUNTER — Telehealth: Payer: Self-pay | Admitting: *Deleted

## 2014-08-18 NOTE — Telephone Encounter (Signed)
Pt birth control pills were switched to Sprintec on 08/16/14 telephone was told to start Sprintec Sunday start after menses. Her mother said she is bleeding changing tampons about every hour to hour in a half - 2 hours, no pain. Her mother asked if she could start her pills now and maybe this will help with the bleeding? She is not taking pills for birth control. Please advise

## 2014-08-18 NOTE — Telephone Encounter (Signed)
Okay to start now

## 2014-08-19 NOTE — Telephone Encounter (Signed)
Pt mother informed

## 2014-08-30 ENCOUNTER — Ambulatory Visit: Payer: 59 | Admitting: Gynecology

## 2014-11-17 ENCOUNTER — Ambulatory Visit: Payer: 59 | Admitting: Gynecology

## 2014-11-17 ENCOUNTER — Other Ambulatory Visit: Payer: 59

## 2015-01-03 ENCOUNTER — Ambulatory Visit: Payer: 59 | Admitting: Gynecology

## 2015-01-03 ENCOUNTER — Other Ambulatory Visit: Payer: 59

## 2015-07-28 ENCOUNTER — Telehealth: Payer: Self-pay

## 2015-07-28 NOTE — Telephone Encounter (Addendum)
I spoke with Dr. Velvet BatheF since patient was waiting to leave for work or come here.  Dr. Velvet BatheF advised that blood preg test could be negative and if she conceived recently it might not detect pregnancy.  He recommended she use Clotrimazole cream vaginally as it is Cat B and ok with pregnancy.    Patient said she has PCP and will make appt with them regarding lumps in her neck.  Annual exam was scheduled for 08/31/15.

## 2015-07-28 NOTE — Telephone Encounter (Signed)
Patient said she is experiencing a yeast infection and lumps in her neck that are causing pain into her shoulder.  She said she called the nurse hot line last night and they told her to see her physician asap.

## 2015-07-28 NOTE — Telephone Encounter (Signed)
Patient advised. She said the nurse last night would not prescribe anything for her because she is not sure if she is pregnant. She went off her OC's because she did not like how she was feeling. She said she has PCOS and some of it's sx mimic pregnancy sx.  She would like to come by and get blood PT checked before taking Difucan. Ok to order?  She will see PCP for other sx.

## 2015-07-28 NOTE — Telephone Encounter (Signed)
#  1 if she is talking about a vaginal yeast infection with itching and irritation then I would recommend Diflucan 150 mg 1 dose #2 reference lumps in her neck she needs to be seen by her primary physician and if she does not have one then at urgent care. This is not a gynecologic issue. #3 she is overdue for her GYN annual exam and I do not see where it is scheduled

## 2015-08-31 ENCOUNTER — Encounter: Payer: Self-pay | Admitting: Gynecology

## 2015-09-09 ENCOUNTER — Ambulatory Visit (INDEPENDENT_AMBULATORY_CARE_PROVIDER_SITE_OTHER): Payer: BLUE CROSS/BLUE SHIELD | Admitting: Gynecology

## 2015-09-09 ENCOUNTER — Encounter: Payer: Self-pay | Admitting: Gynecology

## 2015-09-09 VITALS — BP 118/76 | Ht 67.0 in | Wt 275.0 lb

## 2015-09-09 DIAGNOSIS — Z113 Encounter for screening for infections with a predominantly sexual mode of transmission: Secondary | ICD-10-CM | POA: Diagnosis not present

## 2015-09-09 DIAGNOSIS — Z01419 Encounter for gynecological examination (general) (routine) without abnormal findings: Secondary | ICD-10-CM

## 2015-09-09 DIAGNOSIS — Z23 Encounter for immunization: Secondary | ICD-10-CM

## 2015-09-09 DIAGNOSIS — N912 Amenorrhea, unspecified: Secondary | ICD-10-CM | POA: Diagnosis not present

## 2015-09-09 LAB — HCG, SERUM, QUALITATIVE: PREG SERUM: NEGATIVE

## 2015-09-09 LAB — TSH: TSH: 5.62 mIU/L — ABNORMAL HIGH

## 2015-09-09 MED ORDER — NORGESTIMATE-ETH ESTRADIOL 0.25-35 MG-MCG PO TABS: 1.0000 | ORAL_TABLET | Freq: Every day | ORAL | Status: DC

## 2015-09-09 MED ORDER — MEDROXYPROGESTERONE ACETATE 10 MG PO TABS: 10.0000 mg | ORAL_TABLET | Freq: Every day | ORAL | Status: DC

## 2015-09-09 NOTE — Progress Notes (Signed)
    Jennifer FerraraRion D Weeks 12/29/1990 [id number]        25 y.o.  G0P0000  for annual exam.  Several issues noted below  Past medical history,surgical history, problem list, medications, allergies, family history and social history were all reviewed and documented as reviewed in the EPIC chart.  ROS:  Performed with pertinent positives and negatives included in the history, assessment and plan.   Additional significant findings :  none   Exam: Jennifer PortelaKim Weeks assistant Filed Vitals:   09/09/15 0809  BP: 118/76  Height: 5\' 7"  (1.702 m)  Weight: 275 lb (124.739 kg)   General appearance:  Normal affect, orientation and appearance. Skin: Grossly normal HEENT: Without gross lesions.  No cervical or supraclavicular adenopathy. Thyroid normal.  Lungs:  Clear without wheezing, rales or rhonchi Cardiac: RR, without RMG Abdominal:  Soft, nontender, without masses, guarding, rebound, organomegaly or hernia Breasts:  Examined lying and sitting without masses, retractions, discharge or axillary adenopathy. Pelvic:  Ext/BUS/vagina normal  Cervix normal. GC/Chlamydia  Uterus anteverted, normal size, shape and contour, midline and mobile nontender   Adnexa without masses or tenderness    Anus and perineum normal    Assessment/Plan:  25 y.o. G0P0000 female for annual exam with mild irregular menses, condom contraception.   1. Irregular menses. Patient's last menstrual period was in February. History of irregular menses before with workup negative. Started on low-dose oral contraceptives with good menstrual regulation. She stopped them because she thought it was "unhealthy" after reading on the Internet. Currently is sexually active.  Strongly recommended patient start back on the BCPs. Risks to include thrombosis reviewed. Risks of pregnancy without more consistent contraception also discussed. Patient agrees. Will check hCG. Provera 10 mg 10 days and then start Sprintec Sunday following onset of bleeding.  Refill 1 year provided. Patient knows to check the hCG results before starting the Provera. 2. Gardasil series never. Recommended the patient received the series and she agrees to start today. Follow up in 2 months for next injection. 3. STD screening. GC/Chlamydia done today. 4. Pap smear 2016. No Pap smear done today. Plan repeat at 3 year interval per current screening guidelines. 5. Breast health. SBE monthly reviewed. 6. Health maintenance. Patient asked to check her thyroid. TSH done today. No other blood work done. Follow up for next Gardasil injection otherwise in one year, sooner as needed   Jennifer LordsFONTAINE,Jasmina Gendron P MD, 8:28 AM 09/09/2015

## 2015-09-09 NOTE — Addendum Note (Signed)
Addended by: Dayna BarkerGARDNER, Shadonna Benedick K on: 09/09/2015 08:48 AM   Modules accepted: Orders, SmartSet

## 2015-09-09 NOTE — Patient Instructions (Signed)
Office will call you with the test results. After hearing from the office start Provera medication daily for 10 days to bring on her period. Then start the birth control pills the Sunday following your period.

## 2015-09-12 ENCOUNTER — Other Ambulatory Visit: Payer: Self-pay | Admitting: Gynecology

## 2015-09-12 DIAGNOSIS — R7989 Other specified abnormal findings of blood chemistry: Secondary | ICD-10-CM

## 2015-09-12 LAB — GC/CHLAMYDIA PROBE AMP
CT Probe RNA: NOT DETECTED
GC Probe RNA: NOT DETECTED

## 2015-09-13 ENCOUNTER — Other Ambulatory Visit: Payer: BLUE CROSS/BLUE SHIELD

## 2015-09-13 DIAGNOSIS — R7989 Other specified abnormal findings of blood chemistry: Secondary | ICD-10-CM

## 2015-09-13 LAB — THYROID PANEL WITH TSH
Free Thyroxine Index: 2 (ref 1.4–3.8)
T3 Uptake: 24 % (ref 22–35)
T4, Total: 8.3 ug/dL (ref 4.5–12.0)
TSH: 5.25 mIU/L — ABNORMAL HIGH

## 2015-09-16 ENCOUNTER — Telehealth: Payer: Self-pay | Admitting: *Deleted

## 2015-09-16 DIAGNOSIS — E039 Hypothyroidism, unspecified: Secondary | ICD-10-CM

## 2015-09-16 NOTE — Telephone Encounter (Signed)
Tell patient thyroid shows she may be beginnings of hypothyroid. Recommend appt with Dr Elvera LennoxGherghe to follow  Referral placed they contact pt to scheldule

## 2015-09-21 NOTE — Telephone Encounter (Signed)
Patient called because she has not heard about this appt. I told her referral  Has been placed and she could give Dr. Charlean SanfilippoGherghe's office a call to schedule. I provided her with phone number.

## 2015-09-22 NOTE — Telephone Encounter (Signed)
Appt. 10/12/15 @ 2:00pm with Dr.Kumar

## 2015-10-12 ENCOUNTER — Encounter: Payer: Self-pay | Admitting: Endocrinology

## 2015-10-12 ENCOUNTER — Ambulatory Visit (INDEPENDENT_AMBULATORY_CARE_PROVIDER_SITE_OTHER): Payer: BLUE CROSS/BLUE SHIELD | Admitting: Endocrinology

## 2015-10-12 VITALS — BP 138/96 | HR 86 | Temp 98.4°F | Resp 16 | Ht 67.0 in | Wt 273.4 lb

## 2015-10-12 DIAGNOSIS — R7989 Other specified abnormal findings of blood chemistry: Secondary | ICD-10-CM

## 2015-10-12 DIAGNOSIS — E282 Polycystic ovarian syndrome: Secondary | ICD-10-CM

## 2015-10-12 MED ORDER — LEVOTHYROXINE SODIUM 25 MCG PO TABS: 25.0000 ug | ORAL_TABLET | Freq: Every day | ORAL | Status: DC

## 2015-10-12 NOTE — Progress Notes (Signed)
Patient ID: Jennifer FerraraRion D [place], female   DOB: 08/06/1990, 25 y.o.   MRN: [id number]            Reason for Appointment:  Hypothyroidism, new visit  Referring physician: Dr. Lily PeerFernandez   History of Present Illness:   Hypothyroidism was first diagnosed in 08/2015  At the time of diagnosis patient was having symptoms of intermittent  fatigue for about 2 months She says that periodically she feels exhausted and will need more S She usually wakes up feeling tired but often also gets tired in the evenings She thinks she has also had some recent weight increase although her weight history does not reflect this She does not complain about cold sensitivity, difficulty concentrating and hair loss.  Does have some splitting of her nails .           She had a normal TSH in 2016 and now it is over 5, done twice with normal free T4 index    Wt Readings from Last 3 Encounters:  10/12/15 273 lb 6.4 oz (124.013 kg)  09/09/15 275 lb (124.739 kg)  07/06/14 277 lb (125.646 kg)    Thyroid function results have been as follows:  Lab Results  Component Value Date   TSH 5.25* 09/13/2015   TSH 5.62* 09/09/2015   TSH 3.129 07/06/2014    OTHER active problems are discussed in review of systems  No past medical history on file.  Past Surgical History  Procedure Laterality Date  . Tonsillectomy      Family History  Problem Relation Age of Onset  . Diabetes Neg Hx   . Thyroid disease Neg Hx     Social History:  reports that she has quit smoking. She does not have any smokeless tobacco history on file. She reports that she drinks alcohol. She reports that she does not use illicit drugs.  Allergies:  Allergies  Allergen Reactions  . Amoxicillin-Pot Clavulanate     REACTION: mouth ulcers  . Cephalexin     REACTION: Blisters in mouth      Medication List       This list is accurate as of: 10/12/15  9:21 PM.  Always use your most recent med list.               ALPRAZolam 0.25 MG tablet    Commonly known as:  XANAX  TK 1 T PO  TID UTD     diphenhydrAMINE 25 MG tablet  Commonly known as:  BENADRYL  Take 1 tablet (25 mg total) by mouth every 6 (six) hours as needed.     ibuprofen 800 MG tablet  Commonly known as:  ADVIL,MOTRIN  TK 1 T PO  TID PRN P     levothyroxine 25 MCG tablet  Commonly known as:  SYNTHROID  Take 1 tablet (25 mcg total) by mouth daily before breakfast.     norgestimate-ethinyl estradiol 0.25-35 MG-MCG tablet  Commonly known as:  ORTHO-CYCLEN,SPRINTEC,PREVIFEM  Take 1 tablet by mouth daily.          Review of Systems  Constitutional: Positive for malaise. Negative for diaphoresis.       She has started gaining weight at the age of 25.  Does not exercise  HENT: Negative for headaches.   Eyes: Negative for blurred vision.  Respiratory: Positive for daytime sleepiness. Negative for shortness of breath.        No history of snoring  Cardiovascular: Negative for leg swelling.  Gastrointestinal: Negative for constipation.  Endocrine: Positive for menstrual changes and fatigue. Negative for cold intolerance and heat intolerance.       May occasionally have hot flashes  Genitourinary: Negative for frequency.  Musculoskeletal: Negative for joint pain and muscle cramps.  Skin: Negative for rash.  Psychiatric/Behavioral: Positive for depressed mood and nervousness.Negative for insomnia.       At times may have mild depressed mood but has difficulty with being irritable and some anxiety   PCOS:  About 5 years ago she started noticing that she was having infrequent menstrual cycles.  She had gained a significant weight after the age of 11 and did not have any cycles for about a year.  Subsequently had been having cycles every 1-3 months.  In 2015 she was told to have PCOS based on ultrasound of the ovaries  She has been on birth control pills for the last few years although she stopped these last September and just started back on these Also with  having no menstrual cycles earlier this year she was given Provera for 10 days  She has had difficulty with facial hair and says body here for about 5 years or so. She shaves daily.  She does not think there is any improvement in her facial hair with starting birth control pills             Examination:    BP 138/96 mmHg  Pulse 86  Temp(Src) 98.4 F (36.9 C)  Resp 16  Ht 5\' 7"  (1.702 m)  Wt 273 lb 6.4 oz (124.013 kg)  BMI 42.81 kg/m2  SpO2 97%  GENERAL:  Large build with  generalized obesity, no cushingoid features   No pallor, clubbing or edema.    Skin:  no thinning of the skin.  No facial hair visible, patient is shaving She has some midline  abdominal hair present She has marked neck pigmentation, mostly on the sides and back with some ridging. No alopecia seen  EYES:  No prominence of the eyes or swelling of the eyelids  ENT: Oral mucosa and tongue normal.  NECK: THYROID:  Not palpable.  No lymph nodes in the neck anteriorly.   Has a subcutaneous nodule in the right posterior neck area laterally  HEART:  Normal  S1 and S2; no murmur or click.  CHEST:    Lungs: Vescicular breath sounds heard equally.  No crepitations/ wheeze.  ABDOMEN:  No distention.  Liver and spleen not palpable.  No other mass or tenderness. Pelvic exam deferred to gynecologist  NEUROLOGICAL: Reflexes are bilaterally normal at biceps. Normal monofilament sensation in the toes  JOINTS:  Normal.   Assessment:  HYPOTHYROIDISM: Her TSH is minimally higher and not clear if she has symptomatic hypothyroidism She appears to have some inconsistent fatigue over the last 2 months but also appears to have some depressive symptoms and mild hypersomnolence There is no other history typical of hypothyroidism No history suggestive of sleep apnea She does not have a goiter on exam and no family history of autoimmune thyroid disease also  PCOS with acanthosis: Currently is on birth control pill without  consistent control of her menstrual cycles and also no improvement in her hirsutism Although she has polycystic ovaries on ultrasound she has not had assessment of her free testosterone level  PLAN:    Therapeutic trial of levothyroxine 25 g daily.  Discussed in detail that this is a supplement rather than medication which is identical to the natural thyroid hormone and is needed because her  thyroid may be mildly hypofunctioning.  Also discussed usual causes of hypothyroidism as autoimmune thyroid disease.  If she has any subjective improvement in her fatigue will continue this.  Information given on hypothyroidism  Follow-up in 6 weeks with repeat thyroid levels  Evaluation of PCOS with morning free testosterone and LH levels as well as fasting glucose since she has acanthosis  Discussed possible use of metformin as a treatment for PCOS and given her information on metformin as well as discussed how it works, benefits and possible side effects  Consider weight loss medications if she has difficulty losing weight  Counseling time on subjects discussed above is over 50% of today's 60 minute visit   Bary Limbach 10/12/2015, 9:21 PM

## 2015-10-14 ENCOUNTER — Other Ambulatory Visit: Payer: BLUE CROSS/BLUE SHIELD

## 2015-11-01 ENCOUNTER — Other Ambulatory Visit (INDEPENDENT_AMBULATORY_CARE_PROVIDER_SITE_OTHER): Payer: BLUE CROSS/BLUE SHIELD

## 2015-11-01 DIAGNOSIS — E282 Polycystic ovarian syndrome: Secondary | ICD-10-CM

## 2015-11-01 LAB — GLUCOSE, RANDOM: GLUCOSE: 92 mg/dL (ref 70–99)

## 2015-11-01 LAB — LUTEINIZING HORMONE: LH: 1.88 m[IU]/mL

## 2015-11-08 ENCOUNTER — Ambulatory Visit (INDEPENDENT_AMBULATORY_CARE_PROVIDER_SITE_OTHER): Payer: BLUE CROSS/BLUE SHIELD | Admitting: Gynecology

## 2015-11-08 DIAGNOSIS — Z23 Encounter for immunization: Secondary | ICD-10-CM

## 2015-11-25 ENCOUNTER — Telehealth: Payer: Self-pay

## 2015-11-25 ENCOUNTER — Other Ambulatory Visit: Payer: Self-pay

## 2015-11-25 DIAGNOSIS — R7989 Other specified abnormal findings of blood chemistry: Secondary | ICD-10-CM

## 2015-11-25 NOTE — Telephone Encounter (Signed)
Called gave lab results to patient from previous visit. Set patient up for appointment in 2 weeks. Patient requested to have lab draws at another facility, gave patient information and address to another walk in facility, made sure labs were in on patient chart. Patient acknowledged. No other questions or concerns.

## 2015-11-29 ENCOUNTER — Other Ambulatory Visit (INDEPENDENT_AMBULATORY_CARE_PROVIDER_SITE_OTHER): Payer: BLUE CROSS/BLUE SHIELD

## 2015-11-29 DIAGNOSIS — R7989 Other specified abnormal findings of blood chemistry: Secondary | ICD-10-CM

## 2015-11-29 LAB — TSH: TSH: 2.42 u[IU]/mL (ref 0.35–4.50)

## 2015-12-05 ENCOUNTER — Ambulatory Visit (INDEPENDENT_AMBULATORY_CARE_PROVIDER_SITE_OTHER): Payer: BLUE CROSS/BLUE SHIELD | Admitting: Endocrinology

## 2015-12-05 VITALS — BP 122/82 | HR 93 | Ht 67.0 in | Wt 276.0 lb

## 2015-12-05 DIAGNOSIS — L680 Hirsutism: Secondary | ICD-10-CM | POA: Diagnosis not present

## 2015-12-05 DIAGNOSIS — E282 Polycystic ovarian syndrome: Secondary | ICD-10-CM | POA: Diagnosis not present

## 2015-12-05 NOTE — Progress Notes (Signed)
Patient ID: Jennifer FerraraRion D [place], female   DOB: 09/19/1990, 25 y.o.   MRN: [id number]            Reason for Appointment: Follow-up of various endocrine problems  Referring physician: Dr. Lily PeerFernandez   History of Present Illness:   Hypothyroidism was first diagnosed in 08/2015  At the time of diagnosis patient was having symptoms of intermittent  fatigue for about 2 months She says that periodically she feels exhausted and will need more sleep. Since her TSH was 5.25 she was told to start levothyroxine 25 g as a therapeutic trial.  However she did not start this for some reason and is here for follow-up  She is not complaining of as much fatigue but is still concerned about her difficulty with obesity Thyroid levels are back to normal now  Wt Readings from Last 3 Encounters:  12/05/15 276 lb (125.193 kg)  10/12/15 273 lb 6.4 oz (124.013 kg)  09/09/15 275 lb (124.739 kg)    Thyroid function results have been as follows:  Lab Results  Component Value Date   TSH 2.42 11/29/2015   TSH 5.25* 09/13/2015   TSH 5.62* 09/09/2015    OTHER active problems are discussed in review of systems  No past medical history on file.  Past Surgical History  Procedure Laterality Date  . Tonsillectomy      Family History  Problem Relation Age of Onset  . Diabetes Neg Hx   . Thyroid disease Neg Hx     Social History:  reports that she has quit smoking. She does not have any smokeless tobacco history on file. She reports that she drinks alcohol. She reports that she does not use illicit drugs.  Allergies:  Allergies  Allergen Reactions  . Amoxicillin-Pot Clavulanate     REACTION: mouth ulcers  . Cephalexin     REACTION: Blisters in mouth      Medication List       This list is accurate as of: 12/05/15 11:59 PM.  Always use your most recent med list.               ALPRAZolam 0.25 MG tablet  Commonly known as:  XANAX  TK 1 T PO  TID UTD     diphenhydrAMINE 25 MG tablet    Commonly known as:  BENADRYL  Take 1 tablet (25 mg total) by mouth every 6 (six) hours as needed.     ibuprofen 800 MG tablet  Commonly known as:  ADVIL,MOTRIN  TK 1 T PO  TID PRN P     norgestimate-ethinyl estradiol 0.25-35 MG-MCG tablet  Commonly known as:  ORTHO-CYCLEN,SPRINTEC,PREVIFEM  Take 1 tablet by mouth daily.          Review of Systems  PCOS:  About 5 years ago she started noticing that she was having infrequent menstrual cycles.  She had gained a significant weight after the age of 25 and did not have any cycles for about a year.  Subsequently had been having cycles every 1-3 months.  In 2015 she was told to have PCOS based on ultrasound of the ovaries  She has been on birth control pills for the last few years although she stopped these last September and started back on these In 5/17  She has had difficulty with facial and truncal hair for about 5 years She was supposed to have a free testosterone tested but has not had this drawn.        Examination:  BP 122/82 mmHg  Pulse 93  Ht  (1.702 m)  Wt 276 lb (125.193 kg)  BMI 43.22 kg/m2  SpO2 97%    Assessment:   PCOS with acanthosis:  She does have residual Will need to assess her with labs  ?  Transient hypothyroidism: TSH is normal and will follow periodically  PLAN:   To decide on treatment such as metformin after getting her labs drawn Discussed actions and benefits as well as possible side effects of metformin Also given her list of weight loss medications that she may potentially use If her labs are normal indicating normal testosterone and DHEAS levels may consider Aldactone  Kaily Wragg 12/06/2015, 7:57 AM

## 2015-12-05 NOTE — Patient Instructions (Signed)
Qsymia, Belviq and Contrave

## 2015-12-06 ENCOUNTER — Telehealth: Payer: Self-pay | Admitting: *Deleted

## 2015-12-06 NOTE — Telephone Encounter (Signed)
Pt mother Edwina BarthLatishia ( has DPR access) called and left message in triage asking for a return call, I called and left message for her to call me back.

## 2015-12-12 ENCOUNTER — Telehealth: Payer: Self-pay | Admitting: *Deleted

## 2015-12-12 NOTE — Telephone Encounter (Signed)
Pt mother Debbe Odea ( has DPR access) called stating pt had a bad experience at [facility] office and wishes not to return was referred due to abnormal TSH. Pt asked if you could recommend another endocrinologist?   Pt also c/o back pain due to large breast, mother said they have tried everything to help with this back pain support bras, etc. Pt is at the point where she would like speak with someone about a breast reduction. Mother asked if you thought it would be medically indicated? Please advise

## 2015-12-12 NOTE — Telephone Encounter (Signed)
#  1 Dr. Ernest Haber for endocrinology is good. #2 based on that history it sounds like reduction would be a reasonable option. Would need to see a Engineer, petroleum.

## 2015-12-14 NOTE — Telephone Encounter (Signed)
Pt mother informed with the below, mother asked if you could refer them to to a plastic surgeon? She was told by her PCP that pt will need to be seen by her GYN and office notes will be need to be submitted to plastic surgeon stating it is "Medical Necessity". If you are okay with his pt will schedule office visit.   Please advise

## 2015-12-14 NOTE — Telephone Encounter (Signed)
I will need to see her and examine her to be able to document this.

## 2015-12-14 NOTE — Telephone Encounter (Signed)
Left message for pt mother to call  

## 2015-12-14 NOTE — Telephone Encounter (Signed)
Transferred to appointment desk to schedule.

## 2015-12-21 ENCOUNTER — Encounter: Payer: Self-pay | Admitting: Gynecology

## 2015-12-21 ENCOUNTER — Ambulatory Visit (INDEPENDENT_AMBULATORY_CARE_PROVIDER_SITE_OTHER): Payer: BLUE CROSS/BLUE SHIELD | Admitting: Gynecology

## 2015-12-21 VITALS — BP 122/78

## 2015-12-21 DIAGNOSIS — Z23 Encounter for immunization: Secondary | ICD-10-CM

## 2015-12-21 DIAGNOSIS — N62 Hypertrophy of breast: Secondary | ICD-10-CM | POA: Diagnosis not present

## 2015-12-21 NOTE — Addendum Note (Signed)
Addended by: Dayna Barker on: 12/21/2015 09:14 AM   Modules accepted: Orders

## 2015-12-21 NOTE — Progress Notes (Signed)
    Jennifer Weeks 04/10/1991 [id number]        25 y.o.  G0P0000 presents to discuss breast reduction. Has been having a lot of upper back discomfort and pulling sensations from her breasts when she leans forward. Having shoulder pain from her bras. Has been going on for years but seems to be getting worse.  Past medical history,surgical history, problem list, medications, allergies, family history and social history were all reviewed and documented in the EPIC chart.  Directed ROS with pertinent positives and negatives documented in the history of present illness/assessment and plan.  Exam: Kennon Portela assistant Vitals:   12/21/15 0839  BP: 122/78   General appearance:  Normal Both breast examined lying and sitting pendulous without masses, retractions, discharge, adenopathy.    Assessment/Plan:  25 y.o. G0P0000 with large breasts bilaterally.  Proportional to her size noting weight 275 pounds. Discussed with patient certainly a reduction may help with her upper back and shoulder discomfort but no guarantees. She does note a lot of downward pulling sensations from her breasts which certainly would be benefited from a reduction. I discussed with her the scars involved with the surgery. Recommend patient make an appointment with plastic surgery to discuss and have their input and proceed as she chooses.    Dara Lords MD, 9:04 AM 12/21/2015

## 2015-12-21 NOTE — Patient Instructions (Signed)
Follow up with plastic surgery to discuss breast reduction.

## 2016-01-02 ENCOUNTER — Other Ambulatory Visit: Payer: Self-pay | Admitting: Endocrinology

## 2016-01-09 ENCOUNTER — Other Ambulatory Visit: Payer: BLUE CROSS/BLUE SHIELD

## 2016-01-12 ENCOUNTER — Ambulatory Visit: Payer: BLUE CROSS/BLUE SHIELD | Admitting: Endocrinology

## 2016-01-12 DIAGNOSIS — Z0289 Encounter for other administrative examinations: Secondary | ICD-10-CM

## 2016-02-06 ENCOUNTER — Ambulatory Visit: Payer: BLUE CROSS/BLUE SHIELD | Admitting: Endocrinology

## 2016-02-06 DIAGNOSIS — Z0289 Encounter for other administrative examinations: Secondary | ICD-10-CM

## 2016-02-15 ENCOUNTER — Telehealth: Payer: Self-pay

## 2016-02-15 ENCOUNTER — Other Ambulatory Visit: Payer: Self-pay | Admitting: Gynecology

## 2016-02-15 MED ORDER — FLUCONAZOLE 150 MG PO TABS: 150.0000 mg | ORAL_TABLET | Freq: Once | ORAL | 0 refills | Status: AC

## 2016-02-15 NOTE — Telephone Encounter (Signed)
Rx sent. Mom informed. 

## 2016-02-15 NOTE — Telephone Encounter (Signed)
Okay for Diflucan 150 mg 1 dose 

## 2016-02-15 NOTE — Telephone Encounter (Signed)
Patient's mom called stating that daughter has symptoms of vaginal yeast infection. She and daughter have both had before and are familiar with sx.  She asked if you would prescribe a Diflucan tab for her daughter.

## 2016-02-19 IMAGING — US US PELVIS COMPLETE
2 series · 14 of 25 positions shown · non-contrast
Comparison: None

CLINICAL DATA: Pelvic pain



[Series 1: us pelvis complete · 0.22mm/px · 13 of 62 slices shown (1 of 2)]
[im 1/62]
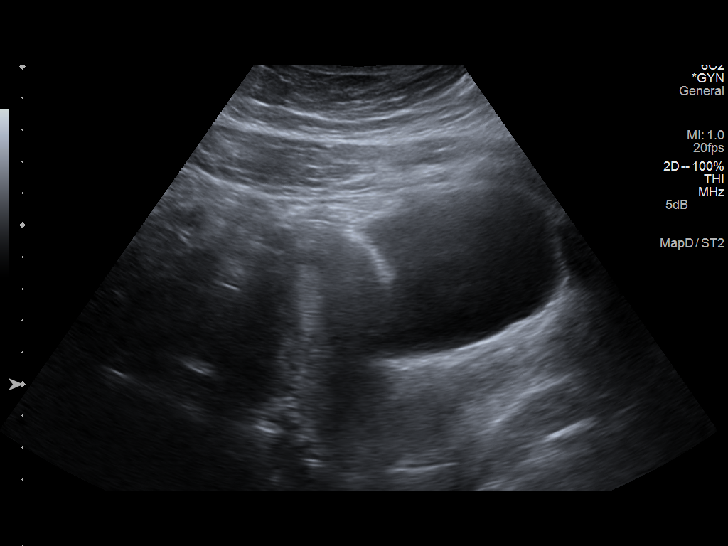
[im 6/62]
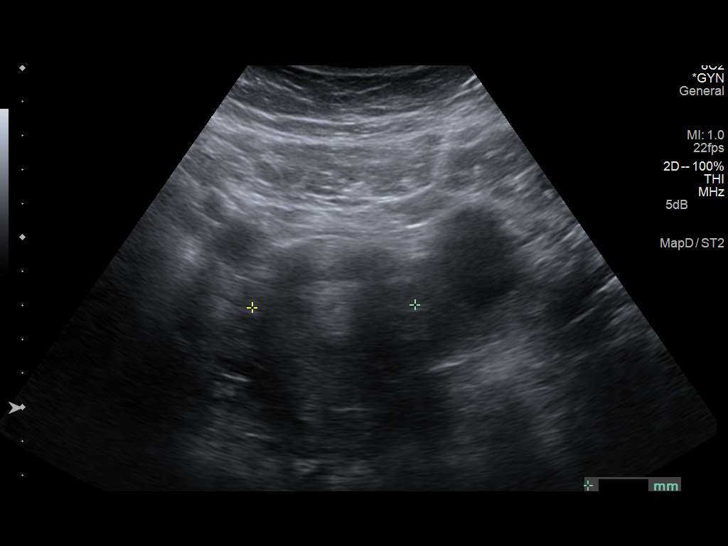
[im 11/62]
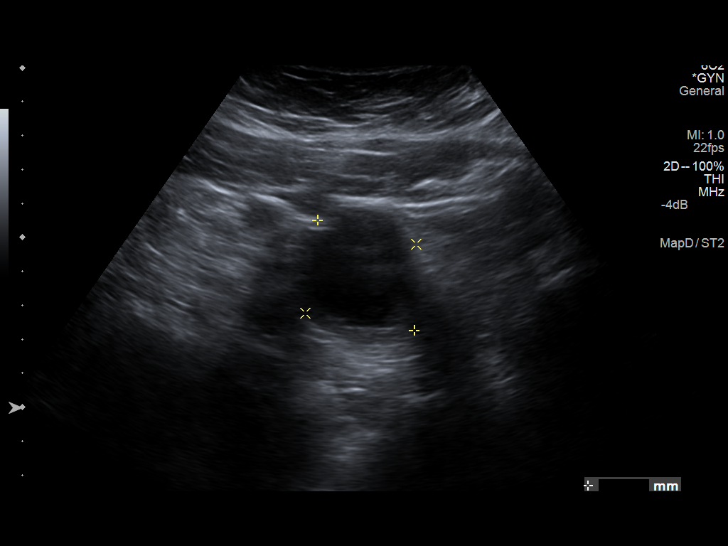
[im 16/62]
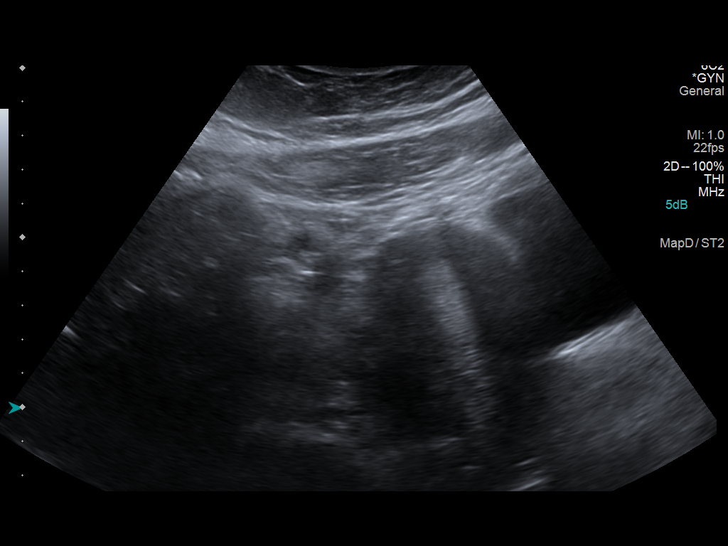
[im 22/62]
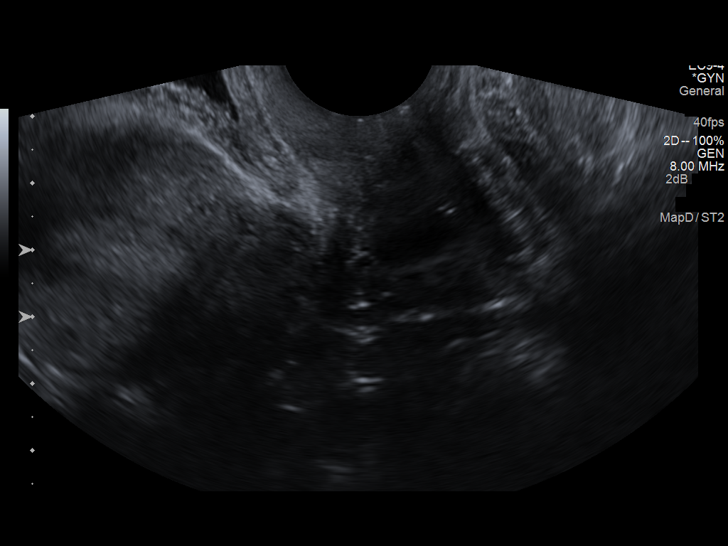
[im 24/62]
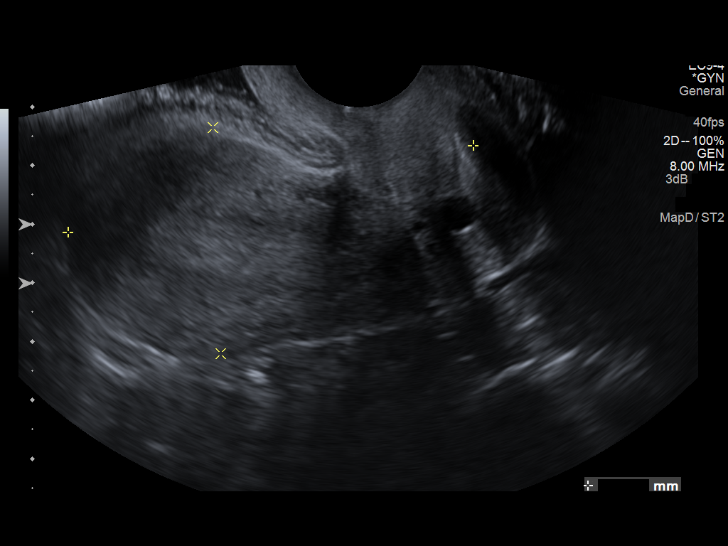
[im 30/62]
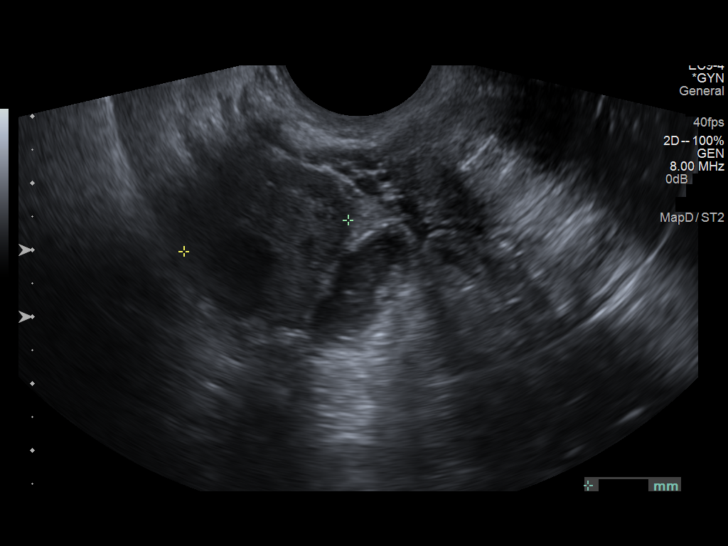
[im 35/62]
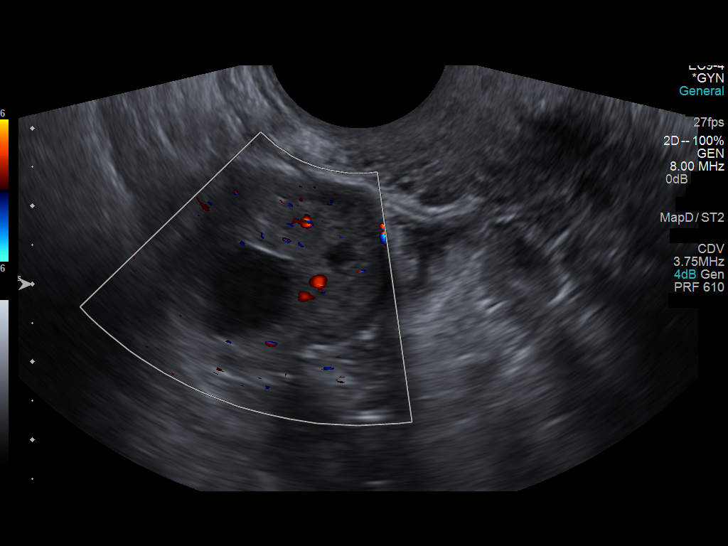
[im 40/62]
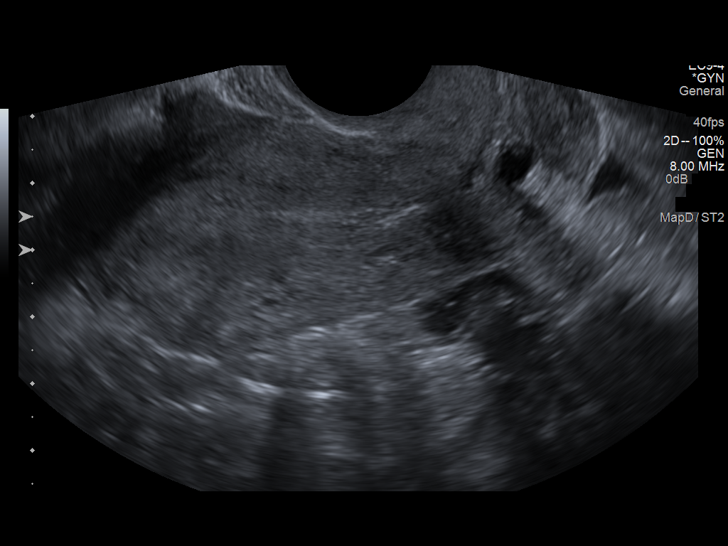
[im 43/62]
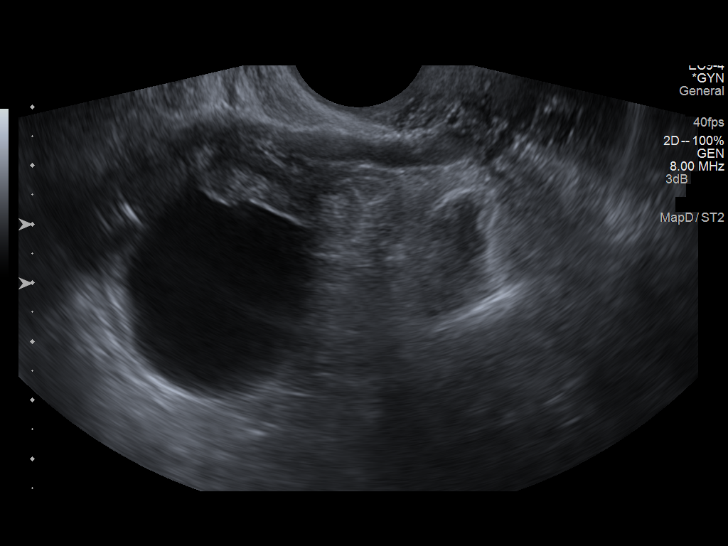
[im 48/62]
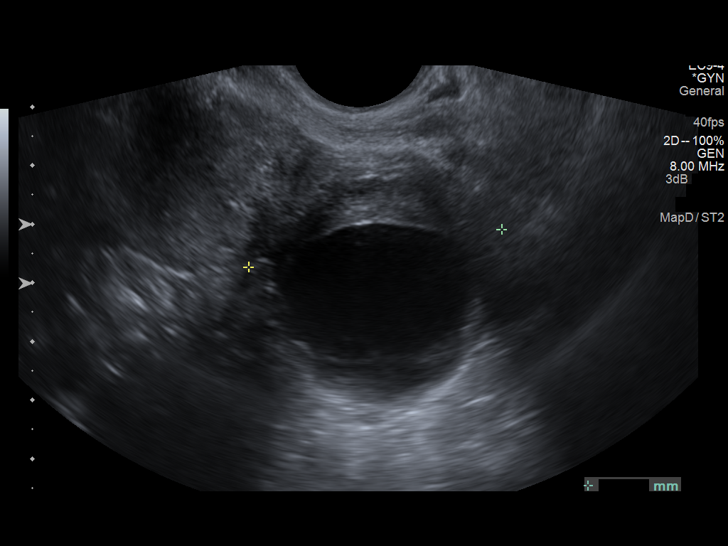
[im 54/62]
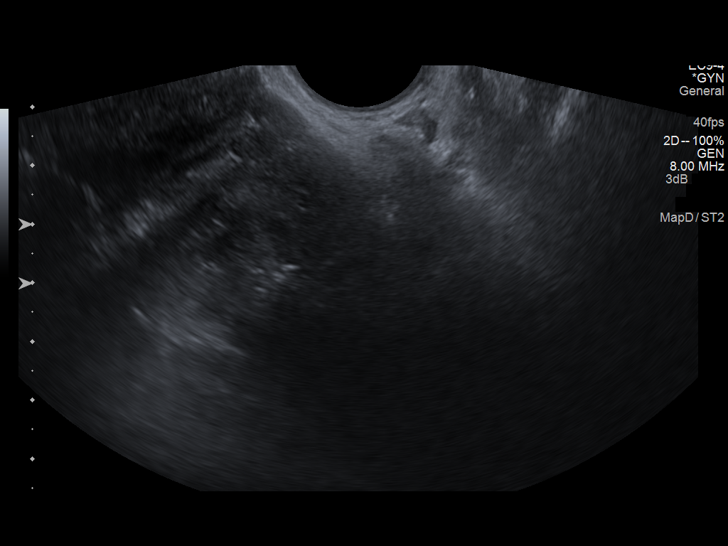
[im 59/62]
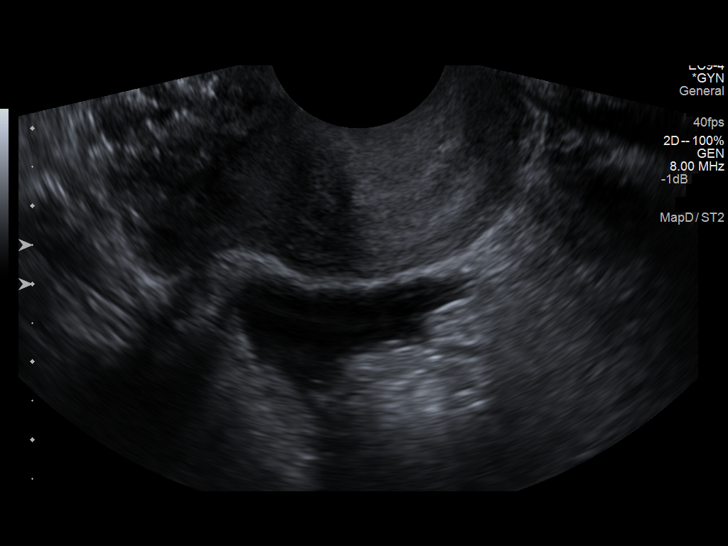

[Series 2: us pelvis complete · 0.09mm/px · 1 of 1 slices shown (2 of 2)]
[im 1/1]
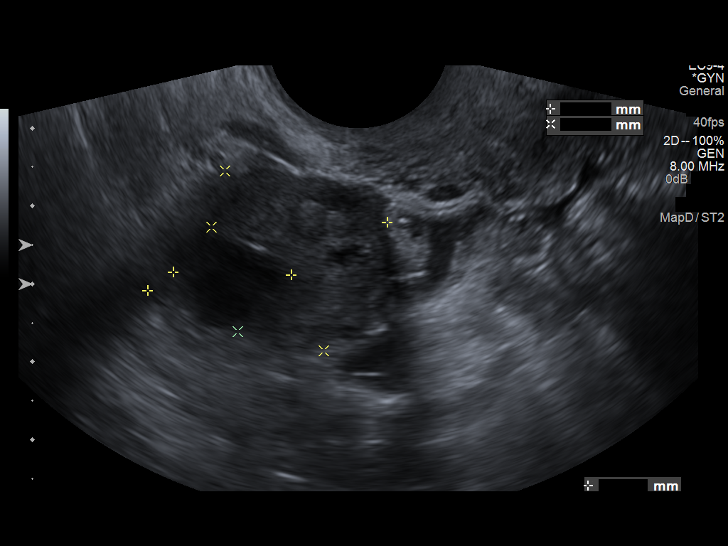

[14 of 25 positions shown; findings below may reference images not displayed]

FINDINGS: Uterus

Measurements: 7.1 x 3.9 x 4.8 cm. Nabothian cyst is noted.

Endometrium

Thickness: 9 mm.  No focal abnormality visualized.

Right ovary

Measurements: 3.2 x 2.6 x 2.5 cm. There is a cyst noted within the
right ovary measuring 1.5 cm in greatest dimension..

Left ovary

Measurements: 5.1 x 3.8 x 4.4 cm. A 3.6 cm cyst is noted within the
left ovary.

Other findings

Minimal free pelvic fluid is noted.
IMPRESSION: Bilateral ovarian cysts more dominant on the left than the right.

## 2016-03-06 ENCOUNTER — Ambulatory Visit (INDEPENDENT_AMBULATORY_CARE_PROVIDER_SITE_OTHER): Payer: BLUE CROSS/BLUE SHIELD | Admitting: Gynecology

## 2016-03-06 ENCOUNTER — Encounter: Payer: Self-pay | Admitting: Gynecology

## 2016-03-06 VITALS — BP 116/74

## 2016-03-06 DIAGNOSIS — Z113 Encounter for screening for infections with a predominantly sexual mode of transmission: Secondary | ICD-10-CM

## 2016-03-06 DIAGNOSIS — J0120 Acute ethmoidal sinusitis, unspecified: Secondary | ICD-10-CM | POA: Diagnosis not present

## 2016-03-06 DIAGNOSIS — Z23 Encounter for immunization: Secondary | ICD-10-CM | POA: Diagnosis not present

## 2016-03-06 MED ORDER — AZITHROMYCIN 250 MG PO TABS: ORAL_TABLET | ORAL | 0 refills | Status: DC

## 2016-03-06 NOTE — Patient Instructions (Signed)
Take 2 pills of the Z-Pak the first day and then 1 pill daily afterwards. Follow up if your symptoms persist, worsen or recur.

## 2016-03-06 NOTE — Addendum Note (Signed)
Addended by: Dayna BarkerGARDNER, Adwoa Axe K on: 03/06/2016 03:40 PM   Modules accepted: Orders

## 2016-03-06 NOTE — Progress Notes (Signed)
    Lucita FerraraRion D Gossard 08/27/1990 [id number]        25 y.o.  G0P0000 presents with 2 issues:  1. Requests STD screening. No known exposure but wants to be screened. No vaginal discharge odor irritation. No urinary symptoms such as frequency dysuria or urgency. 2. Several days of sinusitis type symptoms. Has history of same before. Complaining of congestion, green mucus, periorbital discomfort and throbbing. No fever or chills. No significant cough or sputum.  Past medical history,surgical history, problem list, medications, allergies, family history and social history were all reviewed and documented in the EPIC chart.  Directed ROS with pertinent positives and negatives documented in the history of present illness/assessment and plan.  Exam: Kennon PortelaKim Gardner assistant Vitals:   03/06/16 1420  BP: 116/74   General appearance:  Normal HEENT grossly normal. Does have some mild periorbital bilateral tenderness to palpation. No overlying skin changes or cellulitis. Neck supple without adenopathy. Pharynx without exudate or erythema Lungs clear bilaterally Cardiac regular rate no rubs murmurs or gallops Abdomen soft nontender without masses guarding rebound Pelvic external BUS vagina normal. Cervix normal. Uterus grossly normal size midline mobile nontender. Adnexa without gross masses or tenderness   Assessment/Plan:  25 y.o. G0P0000  with:  1. History and exam consistent with early sinusitis. Will treat with Z-Pak. OTC decongestants/mucolytics also recommended. Follow up if symptoms persist, worsen or recur. 2. STD screening. GC/Chlamydia, HIV, RPR, hepatitis B, hepatitis C done.    Dara LordsFONTAINE,Anelly Samarin P MD, 2:33 PM 03/06/2016

## 2016-03-07 LAB — GC/CHLAMYDIA PROBE AMP
CT PROBE, AMP APTIMA: NOT DETECTED
GC PROBE AMP APTIMA: NOT DETECTED

## 2016-03-07 LAB — HIV ANTIBODY (ROUTINE TESTING W REFLEX): HIV 1&2 Ab, 4th Generation: NONREACTIVE

## 2016-03-07 LAB — HEPATITIS C ANTIBODY: HCV Ab: NEGATIVE

## 2016-03-07 LAB — RPR

## 2016-03-07 LAB — HEPATITIS B SURFACE ANTIGEN: Hepatitis B Surface Ag: NEGATIVE

## 2016-04-30 ENCOUNTER — Telehealth: Payer: Self-pay | Admitting: *Deleted

## 2016-04-30 MED ORDER — FLUCONAZOLE 150 MG PO TABS: 150.0000 mg | ORAL_TABLET | Freq: Once | ORAL | 0 refills | Status: AC

## 2016-04-30 NOTE — Telephone Encounter (Signed)
Pt called c/o yeast infection itching and white discharge was hoping Rx for diflucan tablet could be sent in? Please advise

## 2016-04-30 NOTE — Telephone Encounter (Signed)
Pt informed with the below note, Rx sent. 

## 2016-04-30 NOTE — Telephone Encounter (Signed)
Okay for Diflucan 150 mg 1 dose 

## 2016-08-24 ENCOUNTER — Telehealth: Payer: Self-pay | Admitting: *Deleted

## 2016-08-24 MED ORDER — NORGESTIMATE-ETH ESTRADIOL 0.25-35 MG-MCG PO TABS: 1.0000 | ORAL_TABLET | Freq: Every day | ORAL | 0 refills | Status: DC

## 2016-08-24 NOTE — Telephone Encounter (Signed)
Pt has moved to [place], will still come to see TF for annual, asked if Rx for birth control pill can be sent to pharmacy in [place]. Rx sent.

## 2016-08-27 MED ORDER — NORGESTIMATE-ETH ESTRADIOL 0.25-35 MG-MCG PO TABS: 1.0000 | ORAL_TABLET | Freq: Every day | ORAL | 1 refills | Status: DC

## 2016-08-27 NOTE — Addendum Note (Signed)
Addended by: Aura Camps on: 08/27/2016 02:18 PM   Modules accepted: Orders

## 2016-08-27 NOTE — Telephone Encounter (Signed)
Pt called back stating the pharmacy gave her a generic pill,pt would prefer to have Mononess name which is generic for ortho cyclen. Rx sent.

## 2016-09-26 ENCOUNTER — Telehealth: Payer: Self-pay | Admitting: *Deleted

## 2016-09-26 MED ORDER — AZITHROMYCIN 250 MG PO TABS: ORAL_TABLET | ORAL | 0 refills | Status: DC

## 2016-09-26 NOTE — Telephone Encounter (Signed)
Rx sent, pt aware 

## 2016-09-26 NOTE — Telephone Encounter (Signed)
Okay for Z-Pak. Will need to see urgent care there if symptoms persist or worsen

## 2016-09-26 NOTE — Telephone Encounter (Signed)
Pt mother called stating pt is lives in [place] now due to work and has sinus infection, states you have prescribed a Z-pack in the past for this. Asked if you would be willing to do this again? Pt is overdue for annual,state she will schedule. Please advise

## 2016-10-03 ENCOUNTER — Encounter: Payer: Self-pay | Admitting: Gynecology

## 2016-10-26 ENCOUNTER — Telehealth: Payer: Self-pay

## 2016-10-26 MED ORDER — FLUCONAZOLE 150 MG PO TABS: 150.0000 mg | ORAL_TABLET | Freq: Once | ORAL | 0 refills | Status: AC

## 2016-10-26 NOTE — Telephone Encounter (Signed)
Patient called asking for Diflucan tab for vaginal yeast infection. She said she has them occasionally and is familiar with symptoms.   She was due for CE back in April and I will ask her to schedule CE while I have her on phone.

## 2016-10-26 NOTE — Telephone Encounter (Signed)
Rx sent. Patient informed.  I advised overdue for annual exam. Patient said she lives in [place] and has to find time to come here. She thinks she will be able to schedule mid-July. She wants to call back to schedule appt.

## 2016-10-26 NOTE — Telephone Encounter (Signed)
Okay for Diflucan 150 mg 1 dose 

## 2016-11-20 ENCOUNTER — Other Ambulatory Visit: Payer: Self-pay | Admitting: Gynecology

## 2016-12-21 ENCOUNTER — Other Ambulatory Visit: Payer: Self-pay | Admitting: Women's Health

## 2016-12-23 ENCOUNTER — Telehealth: Payer: Self-pay | Admitting: Gynecology

## 2016-12-23 NOTE — Telephone Encounter (Signed)
On Call Note:  Needs Sprintec BCP refill tonight.  Last annual April 2017.   One pack with refill x one.  Need for annual within next 2 months for further refills stressed.  Pharmacy called [phone]

## 2017-01-19 DIAGNOSIS — R87810 Cervical high risk human papillomavirus (HPV) DNA test positive: Secondary | ICD-10-CM

## 2017-01-19 DIAGNOSIS — R87610 Atypical squamous cells of undetermined significance on cytologic smear of cervix (ASC-US): Secondary | ICD-10-CM

## 2017-01-19 HISTORY — DX: Atypical squamous cells of undetermined significance on cytologic smear of cervix (ASC-US): R87.610

## 2017-01-19 HISTORY — DX: Cervical high risk human papillomavirus (HPV) DNA test positive: R87.810

## 2017-01-22 ENCOUNTER — Ambulatory Visit (INDEPENDENT_AMBULATORY_CARE_PROVIDER_SITE_OTHER): Payer: 59 | Admitting: Gynecology

## 2017-01-22 ENCOUNTER — Encounter: Payer: Self-pay | Admitting: Gynecology

## 2017-01-22 VITALS — BP 118/74 | Ht 66.0 in | Wt 247.0 lb

## 2017-01-22 DIAGNOSIS — R5383 Other fatigue: Secondary | ICD-10-CM

## 2017-01-22 DIAGNOSIS — Z01419 Encounter for gynecological examination (general) (routine) without abnormal findings: Secondary | ICD-10-CM | POA: Diagnosis not present

## 2017-01-22 DIAGNOSIS — Z113 Encounter for screening for infections with a predominantly sexual mode of transmission: Secondary | ICD-10-CM

## 2017-01-22 LAB — CBC WITH DIFFERENTIAL/PLATELET
Basophils Absolute: 85 cells/uL (ref 0–200)
Basophils Relative: 1 %
EOS ABS: 85 {cells}/uL (ref 15–500)
Eosinophils Relative: 1 %
HEMATOCRIT: 39.6 % (ref 35.0–45.0)
HEMOGLOBIN: 13.2 g/dL (ref 11.7–15.5)
LYMPHS ABS: 3145 {cells}/uL (ref 850–3900)
Lymphocytes Relative: 37 %
MCH: 26.4 pg — ABNORMAL LOW (ref 27.0–33.0)
MCHC: 33.3 g/dL (ref 32.0–36.0)
MCV: 79.2 fL — AB (ref 80.0–100.0)
MONO ABS: 510 {cells}/uL (ref 200–950)
MPV: 9.8 fL (ref 7.5–12.5)
Monocytes Relative: 6 %
NEUTROS ABS: 4675 {cells}/uL (ref 1500–7800)
Neutrophils Relative %: 55 %
Platelets: 311 10*3/uL (ref 140–400)
RBC: 5 MIL/uL (ref 3.80–5.10)
RDW: 14 % (ref 11.0–15.0)
WBC: 8.5 10*3/uL (ref 3.8–10.8)

## 2017-01-22 MED ORDER — SPRINTEC 28 0.25-35 MG-MCG PO TABS: 1.0000 | ORAL_TABLET | Freq: Every day | ORAL | 12 refills | Status: AC

## 2017-01-22 NOTE — Addendum Note (Signed)
Addended by: Dayna BarkerGARDNER, KIMBERLY K on: 01/22/2017 03:17 PM   Modules accepted: Orders

## 2017-01-22 NOTE — Progress Notes (Signed)
    Jennifer FerraraRion D Weeks 09/10/1990 [id number]        26 y.o.  G0P0000 for annual exam.  Also notes some generalized fatigue. Just feels tired at the end of the day. Does not exercise on a regular basis. No significant weight gain or weight loss. No hair skin changes. No nausea vomiting diarrhea constipation.  Past medical history,surgical history, problem list, medications, allergies, family history and social history were all reviewed and documented as reviewed in the EPIC chart.  ROS:  Performed with pertinent positives and negatives included in the history, assessment and plan.   Additional significant findings :  None   Exam: Kennon PortelaKim Gardner assistant Vitals:   01/22/17 1428  BP: 118/74  Weight: 247 lb (112 kg)  Height: 5\' 6"  (1.676 m)   Body mass index is 39.87 kg/m.  General appearance:  Normal affect, orientation and appearance. Skin: Grossly normal HEENT: Without gross lesions.  No cervical or supraclavicular adenopathy. Thyroid normal.  Lungs:  Clear without wheezing, rales or rhonchi Cardiac: RR, without RMG Abdominal:  Soft, nontender, without masses, guarding, rebound, organomegaly or hernia Breasts:  Examined lying and sitting without masses, retractions, discharge or axillary adenopathy. Pelvic:  Ext, BUS, Vagina: Normal  Cervix: Normal. Pap smear done. GC/chlamydia screen  Uterus: Anteverted, normal size, shape and contour, midline and mobile nontender   Adnexa: Without masses or tenderness    Anus and perineum: Normal     Assessment/Plan:  26 y.o. G0P0000 female for annual exam with regular menses, oral contraceptives.   1. Oral contraceptives. Doing well with Sprintec. She does note whenever she gets the generic MonoNessa she has more emotional swings and prefers the Sprintec brand. D.a.w. written for Sprintec 1 year. 2. Having some hot flushes and sweats. Also complaining of fatigue at the end of the day. No localizing symptoms. Exam is normal. Will check baseline  CBC, CMP and TSH. Recommend follow up with her primary physician if symptoms continue. 3. Breast health. SBE monthly reviewed. 4. Gardasil series reportedly received 3. 5. STD screening. No known exposure but requests screening. GC/Chlamydia, HIV, RPR, hepatitis B, hepatitis C. 6. Pap smear 2016. Pap smear done today at 2-1/2 year interval. No history of abnormal Pap smears. 7. Health maintenance. Above blood work ordered. Follow up in one year, sooner as needed.   Dara LordsFONTAINE,TIMOTHY P MD, 2:58 PM 01/22/2017

## 2017-01-22 NOTE — Patient Instructions (Signed)
Follow up with your primary physician if the swelling continues.  Follow up in one year for annual exam.

## 2017-01-23 LAB — COMPREHENSIVE METABOLIC PANEL
ALK PHOS: 49 U/L (ref 33–115)
ALT: 16 U/L (ref 6–29)
AST: 16 U/L (ref 10–30)
Albumin: 3.8 g/dL (ref 3.6–5.1)
BILIRUBIN TOTAL: 0.3 mg/dL (ref 0.2–1.2)
BUN: 9 mg/dL (ref 7–25)
CALCIUM: 9.1 mg/dL (ref 8.6–10.2)
CO2: 16 mmol/L — ABNORMAL LOW (ref 20–32)
CREATININE: 0.76 mg/dL (ref 0.50–1.10)
Chloride: 107 mmol/L (ref 98–110)
GLUCOSE: 93 mg/dL (ref 65–99)
Potassium: 4.4 mmol/L (ref 3.5–5.3)
Sodium: 137 mmol/L (ref 135–146)
Total Protein: 6.8 g/dL (ref 6.1–8.1)

## 2017-01-23 LAB — TSH: TSH: 2.53 m[IU]/L

## 2017-01-23 LAB — HEPATITIS C ANTIBODY: HCV AB: NONREACTIVE

## 2017-01-23 LAB — HEPATITIS B SURFACE ANTIGEN: Hepatitis B Surface Ag: NONREACTIVE

## 2017-01-23 LAB — RPR

## 2017-01-23 LAB — HIV ANTIBODY (ROUTINE TESTING W REFLEX): HIV: NONREACTIVE

## 2017-01-24 LAB — PAP IG W/ RFLX HPV ASCU

## 2017-01-24 LAB — GC/CHLAMYDIA PROBE AMP
CT PROBE, AMP APTIMA: NOT DETECTED
GC PROBE AMP APTIMA: NOT DETECTED

## 2017-01-25 LAB — HUMAN PAPILLOMAVIRUS, HIGH RISK: HPV DNA High Risk: DETECTED — AB

## 2017-01-28 ENCOUNTER — Encounter: Payer: Self-pay | Admitting: Gynecology

## 2017-05-16 ENCOUNTER — Encounter: Payer: Self-pay | Admitting: Gynecology

## 2017-05-16 ENCOUNTER — Ambulatory Visit (INDEPENDENT_AMBULATORY_CARE_PROVIDER_SITE_OTHER): Payer: 59 | Admitting: Gynecology

## 2017-05-16 VITALS — BP 122/80

## 2017-05-16 DIAGNOSIS — R87811 Vaginal high risk human papillomavirus (HPV) DNA test positive: Secondary | ICD-10-CM

## 2017-05-16 DIAGNOSIS — R87620 Atypical squamous cells of undetermined significance on cytologic smear of vagina (ASC-US): Secondary | ICD-10-CM

## 2017-05-16 NOTE — Patient Instructions (Signed)
Office will call with biopsy results 

## 2017-05-16 NOTE — Progress Notes (Signed)
    Lucita FerraraRion D Franssen 05/28/1990 [id number]        26 y.o.  G0P0000 presents for colposcopy.  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  No history of abnormal Pap smears previously.  Past medical history,surgical history, problem list, medications, allergies, family history and social history were all reviewed and documented in the EPIC chart.  Directed ROS with pertinent positives and negatives documented in the history of present illness/assessment and plan.  Exam: Bari MantisKim Alexis assistant Vitals:   05/16/17 1000  BP: 122/80   General appearance:  Normal Abdomen soft nontender without masses guarding rebound Pelvic external BUS vagina normal.  Cervix grossly normal.  Uterus normal size midline mobile nontender.  Adnexa without mass.  Colposcopy performed after acetic acid cleanse was adequate with transformation zone visualized 360 degrees.  No abnormalities were seen.  ECC performed.  Patient tolerated well.  Assessment/Plan:  26 y.o. 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  Colposcopy was adequate and normal.  ECC performed.  Patient will follow-up for results.  Assuming negative then plan expectant management with follow-up Pap smear/HPV in 1 year.  We discussed at length with her mother present the issue of cervical cytology and dysplasia, high-grade/low-grade, progression/regression.  The HPV association was also reviewed.    Dara Lordsimothy P Fontaine MD, 10:18 AM 05/16/2017

## 2017-05-16 NOTE — Addendum Note (Signed)
Addended by: Tito DineBONHAM, KIM A on: 05/16/2017 10:39 AM   Modules accepted: Orders

## 2017-05-17 ENCOUNTER — Encounter: Payer: Self-pay | Admitting: Gynecology

## 2017-09-19 ENCOUNTER — Telehealth: Payer: Self-pay | Admitting: *Deleted

## 2017-09-19 NOTE — Telephone Encounter (Signed)
Patient mother Debbe Odea( has DPR access) called stating patient has started cycle today and had bad cramps with nausea. Normal bleeding flow,not heavy, passed small clots, I asked if chance of pregnancy and mother declined stating no. Patient has not tried anything OTC yet. I recommend she try OTC pain relief with heating pad and see how she does with that. Mother verbalized she understood.

## 2017-09-20 ENCOUNTER — Telehealth: Payer: Self-pay | Admitting: *Deleted

## 2017-09-20 NOTE — Telephone Encounter (Signed)
Patient called c/o bad menstrual cramps, cycle started 09/19/17 taking OTC ibuprofen for cramps, pain level 4 now, but has been at level 7 at times. Normal bleeding flow, not heavy, no vomiting, no fever, c/o being hot and cold off/on, c/o headache, no urinary symptoms, able to eat and drink fine. I asked if this is new, and patient said this happened 1 time before. Patient would like your recommendations. Please advise

## 2017-09-20 NOTE — Telephone Encounter (Signed)
We will continue with the ibuprofen and monitor for now.  Office visit if continues/recurs.

## 2017-09-20 NOTE — Telephone Encounter (Signed)
Pt mother informed, will relay to patient.

## 2017-12-06 ENCOUNTER — Telehealth: Payer: Self-pay | Admitting: *Deleted

## 2017-12-06 MED ORDER — FLUCONAZOLE 150 MG PO TABS: 150.0000 mg | ORAL_TABLET | Freq: Once | ORAL | 0 refills | Status: AC

## 2017-12-06 NOTE — Telephone Encounter (Signed)
Patient called c/o infection c/o vaginal itching, asked if diflucan tablet can be sent?

## 2017-12-06 NOTE — Telephone Encounter (Signed)
Patient mother informed.

## 2017-12-06 NOTE — Telephone Encounter (Signed)
Okay for Diflucan 150 mg x 1 dose 

## 2018-01-08 ENCOUNTER — Telehealth: Payer: Self-pay | Admitting: *Deleted

## 2018-01-08 MED ORDER — FLUCONAZOLE 150 MG PO TABS: ORAL_TABLET | ORAL | 1 refills | Status: AC

## 2018-01-08 NOTE — Telephone Encounter (Signed)
Patient called c/o vaginal itching x 2 months after cycle only. She asked if diflucan tablet could be sent to pharmacy to help with this? She lives in [place]. Please advise

## 2018-01-08 NOTE — Telephone Encounter (Signed)
Patient informed, Rx sent.  

## 2018-01-08 NOTE — Telephone Encounter (Signed)
Okay for Diflucan 150 mg #4 1 p.o. at end of menstrual cycle to help prevent recurrent yeast infections.  Refill x1

## 2019-02-09 ENCOUNTER — Encounter: Payer: Self-pay | Admitting: Gynecology
